# Patient Record
Sex: Female | Born: 1955 | Race: White | Hispanic: No | Marital: Married | Smoking: Never smoker
Health system: Southern US, Community
[De-identification: ages and names within clinical notes are randomized; demographics above are authoritative.]

## PROBLEM LIST (undated history)

## (undated) DIAGNOSIS — N938 Other specified abnormal uterine and vaginal bleeding: Secondary | ICD-10-CM

## (undated) DIAGNOSIS — N8000 Endometriosis of the uterus, unspecified: Secondary | ICD-10-CM

## (undated) DIAGNOSIS — R102 Pelvic and perineal pain: Secondary | ICD-10-CM

## (undated) DIAGNOSIS — K219 Gastro-esophageal reflux disease without esophagitis: Secondary | ICD-10-CM

## (undated) DIAGNOSIS — N8003 Adenomyosis of the uterus: Secondary | ICD-10-CM

## (undated) DIAGNOSIS — F419 Anxiety disorder, unspecified: Secondary | ICD-10-CM

## (undated) DIAGNOSIS — Z973 Presence of spectacles and contact lenses: Secondary | ICD-10-CM

## (undated) HISTORY — PX: COLONOSCOPY: SHX174

---

## 1998-08-22 ENCOUNTER — Other Ambulatory Visit: Admission: RE | Admit: 1998-08-22 | Discharge: 1998-08-22 | Payer: Self-pay | Admitting: Obstetrics & Gynecology

## 1999-08-31 ENCOUNTER — Other Ambulatory Visit: Admission: RE | Admit: 1999-08-31 | Discharge: 1999-08-31 | Payer: Self-pay | Admitting: Obstetrics & Gynecology

## 1999-09-21 ENCOUNTER — Encounter: Admission: RE | Admit: 1999-09-21 | Discharge: 1999-09-21 | Payer: Self-pay | Admitting: Internal Medicine

## 1999-09-21 ENCOUNTER — Encounter: Payer: Self-pay | Admitting: Internal Medicine

## 2000-09-22 ENCOUNTER — Other Ambulatory Visit: Admission: RE | Admit: 2000-09-22 | Discharge: 2000-09-22 | Payer: Self-pay | Admitting: Obstetrics & Gynecology

## 2001-07-13 ENCOUNTER — Encounter: Payer: Self-pay | Admitting: Neurosurgery

## 2001-07-13 ENCOUNTER — Encounter: Admission: RE | Admit: 2001-07-13 | Discharge: 2001-07-13 | Payer: Self-pay | Admitting: Neurosurgery

## 2001-10-19 ENCOUNTER — Other Ambulatory Visit: Admission: RE | Admit: 2001-10-19 | Discharge: 2001-10-19 | Payer: Self-pay | Admitting: Obstetrics & Gynecology

## 2002-12-09 ENCOUNTER — Other Ambulatory Visit: Admission: RE | Admit: 2002-12-09 | Discharge: 2002-12-09 | Payer: Self-pay | Admitting: Obstetrics & Gynecology

## 2004-01-09 ENCOUNTER — Other Ambulatory Visit: Admission: RE | Admit: 2004-01-09 | Discharge: 2004-01-09 | Payer: Self-pay | Admitting: Obstetrics & Gynecology

## 2005-04-24 ENCOUNTER — Other Ambulatory Visit: Admission: RE | Admit: 2005-04-24 | Discharge: 2005-04-24 | Payer: Self-pay | Admitting: Obstetrics & Gynecology

## 2007-09-25 ENCOUNTER — Encounter: Admission: RE | Admit: 2007-09-25 | Discharge: 2007-09-25 | Payer: Self-pay | Admitting: Internal Medicine

## 2012-08-21 ENCOUNTER — Other Ambulatory Visit (HOSPITAL_COMMUNITY): Payer: Self-pay | Admitting: Obstetrics & Gynecology

## 2012-08-21 DIAGNOSIS — Z139 Encounter for screening, unspecified: Secondary | ICD-10-CM

## 2012-08-25 ENCOUNTER — Ambulatory Visit (HOSPITAL_COMMUNITY)
Admission: RE | Admit: 2012-08-25 | Discharge: 2012-08-25 | Disposition: A | Discharge status: Home / Self Care | Payer: 59 | Source: Ambulatory Visit | Attending: Obstetrics & Gynecology | Admitting: Obstetrics & Gynecology

## 2012-08-25 DIAGNOSIS — Z1231 Encounter for screening mammogram for malignant neoplasm of breast: Secondary | ICD-10-CM | POA: Insufficient documentation

## 2012-08-25 DIAGNOSIS — Z139 Encounter for screening, unspecified: Secondary | ICD-10-CM

## 2012-08-26 ENCOUNTER — Other Ambulatory Visit (HOSPITAL_COMMUNITY): Payer: Self-pay | Admitting: Obstetrics & Gynecology

## 2012-08-26 DIAGNOSIS — N950 Postmenopausal bleeding: Secondary | ICD-10-CM

## 2012-09-07 DIAGNOSIS — J329 Chronic sinusitis, unspecified: Secondary | ICD-10-CM

## 2012-09-07 DIAGNOSIS — J45909 Unspecified asthma, uncomplicated: Secondary | ICD-10-CM

## 2012-09-11 ENCOUNTER — Ambulatory Visit (HOSPITAL_COMMUNITY)
Admission: RE | Admit: 2012-09-11 | Discharge: 2012-09-11 | Disposition: A | Discharge status: Home / Self Care | Payer: 59 | Source: Ambulatory Visit | Attending: Obstetrics & Gynecology | Admitting: Obstetrics & Gynecology

## 2012-09-11 DIAGNOSIS — N950 Postmenopausal bleeding: Secondary | ICD-10-CM | POA: Insufficient documentation

## 2012-09-11 DIAGNOSIS — R9389 Abnormal findings on diagnostic imaging of other specified body structures: Secondary | ICD-10-CM | POA: Insufficient documentation

## 2012-09-23 ENCOUNTER — Encounter (HOSPITAL_COMMUNITY): Payer: Self-pay | Admitting: *Deleted

## 2012-09-23 NOTE — Progress Notes (Addendum)
Pt on Phentermine/Topimax-since January-notified Dr Abram Sander surgery needs to be rescheduled 1 week out

## 2012-09-24 ENCOUNTER — Encounter (HOSPITAL_COMMUNITY)
Admission: RE | Disposition: A | Discharge status: Home / Self Care | Payer: Self-pay | Source: Ambulatory Visit | Attending: Obstetrics & Gynecology

## 2012-09-24 ENCOUNTER — Ambulatory Visit (HOSPITAL_COMMUNITY)
Admission: RE | Admit: 2012-09-24 | Discharge: 2012-09-24 | Disposition: A | Discharge status: Home / Self Care | Payer: 59 | Source: Ambulatory Visit | Attending: Obstetrics & Gynecology | Admitting: Obstetrics & Gynecology

## 2012-09-24 ENCOUNTER — Ambulatory Visit (HOSPITAL_COMMUNITY): Payer: 59 | Admitting: Anesthesiology

## 2012-09-24 ENCOUNTER — Encounter (HOSPITAL_COMMUNITY): Payer: Self-pay | Admitting: *Deleted

## 2012-09-24 ENCOUNTER — Encounter (HOSPITAL_COMMUNITY): Payer: Self-pay | Admitting: Anesthesiology

## 2012-09-24 DIAGNOSIS — N840 Polyp of corpus uteri: Secondary | ICD-10-CM

## 2012-09-24 DIAGNOSIS — R9389 Abnormal findings on diagnostic imaging of other specified body structures: Secondary | ICD-10-CM | POA: Insufficient documentation

## 2012-09-24 DIAGNOSIS — Z79890 Hormone replacement therapy (postmenopausal): Secondary | ICD-10-CM | POA: Insufficient documentation

## 2012-09-24 DIAGNOSIS — N950 Postmenopausal bleeding: Secondary | ICD-10-CM | POA: Insufficient documentation

## 2012-09-24 HISTORY — DX: Gastro-esophageal reflux disease without esophagitis: K21.9

## 2012-09-24 HISTORY — PX: HYSTEROSCOPY WITH D & C: SHX1775

## 2012-09-24 HISTORY — DX: Anxiety disorder, unspecified: F41.9

## 2012-09-24 LAB — CBC
HCT: 43.7 % (ref 36.0–46.0)
Hemoglobin: 13.8 g/dL (ref 12.0–15.0)
RDW: 13.3 % (ref 11.5–15.5)
WBC: 5.1 10*3/uL (ref 4.0–10.5)

## 2012-09-24 SURGERY — DILATATION AND CURETTAGE /HYSTEROSCOPY
Anesthesia: General

## 2012-09-24 MED ORDER — FENTANYL CITRATE 0.05 MG/ML IJ SOLN
INTRAMUSCULAR | Status: AC
  Filled 2012-09-24: qty 2

## 2012-09-24 MED ORDER — MIDAZOLAM HCL 2 MG/2ML IJ SOLN
INTRAMUSCULAR | Status: AC
  Filled 2012-09-24: qty 2

## 2012-09-24 MED ORDER — LIDOCAINE HCL (CARDIAC) 20 MG/ML IV SOLN
INTRAVENOUS | Status: AC
  Filled 2012-09-24: qty 5

## 2012-09-24 MED ORDER — FENTANYL CITRATE 0.05 MG/ML IJ SOLN
INTRAMUSCULAR | Status: DC | PRN
  Administered 2012-09-24: 100 ug via INTRAVENOUS

## 2012-09-24 MED ORDER — GENTAMICIN SULFATE 40 MG/ML IJ SOLN
INTRAMUSCULAR | Status: AC
  Administered 2012-09-24: 100 mL via INTRAVENOUS
  Filled 2012-09-24: qty 9.66

## 2012-09-24 MED ORDER — MIDAZOLAM HCL 5 MG/5ML IJ SOLN
INTRAMUSCULAR | Status: DC | PRN
  Administered 2012-09-24: 2 mg via INTRAVENOUS

## 2012-09-24 MED ORDER — LIDOCAINE HCL 1 % IJ SOLN
INTRAMUSCULAR | Status: DC | PRN
  Administered 2012-09-24: 10 mL

## 2012-09-24 MED ORDER — GLYCINE 1.5 % IR SOLN
Status: DC | PRN
  Administered 2012-09-24: 3000 mL

## 2012-09-24 MED ORDER — ONDANSETRON HCL 4 MG/2ML IJ SOLN
INTRAMUSCULAR | Status: DC | PRN
  Administered 2012-09-24: 4 mg via INTRAVENOUS

## 2012-09-24 MED ORDER — ONDANSETRON HCL 4 MG/2ML IJ SOLN
INTRAMUSCULAR | Status: AC
  Filled 2012-09-24: qty 2

## 2012-09-24 MED ORDER — PROPOFOL 10 MG/ML IV EMUL
INTRAVENOUS | Status: AC
  Filled 2012-09-24: qty 20

## 2012-09-24 MED ORDER — LIDOCAINE HCL (CARDIAC) 20 MG/ML IV SOLN
INTRAVENOUS | Status: DC | PRN
  Administered 2012-09-24: 50 mg via INTRAVENOUS

## 2012-09-24 MED ORDER — LACTATED RINGERS IV SOLN
INTRAVENOUS | Status: DC
  Administered 2012-09-24 (×2): via INTRAVENOUS

## 2012-09-24 MED ORDER — DEXAMETHASONE SODIUM PHOSPHATE 10 MG/ML IJ SOLN
INTRAMUSCULAR | Status: DC | PRN
  Administered 2012-09-24: 10 mg via INTRAVENOUS

## 2012-09-24 MED ORDER — PROPOFOL 10 MG/ML IV EMUL
INTRAVENOUS | Status: DC | PRN
  Administered 2012-09-24: 20 mg via INTRAVENOUS
  Administered 2012-09-24: 50 mg via INTRAVENOUS
  Administered 2012-09-24: 20 mg via INTRAVENOUS

## 2012-09-24 MED ORDER — LACTATED RINGERS IV SOLN
INTRAVENOUS | Status: DC
  Administered 2012-09-24: 09:00:00 via INTRAVENOUS

## 2012-09-24 SURGICAL SUPPLY — 15 items
ABLATOR ENDOMETRIAL BIPOLAR (ABLATOR) IMPLANT
CANISTER SUCTION 2500CC (MISCELLANEOUS) ×2 IMPLANT
CLOTH BEACON ORANGE TIMEOUT ST (SAFETY) ×2 IMPLANT
CONTAINER PREFILL 10% NBF 60ML (FORM) ×4 IMPLANT
DRESSING TELFA 8X3 (GAUZE/BANDAGES/DRESSINGS) ×2 IMPLANT
ELECT REM PT RETURN 9FT ADLT (ELECTROSURGICAL)
ELECTRODE REM PT RTRN 9FT ADLT (ELECTROSURGICAL) IMPLANT
GLOVE BIO SURGEON STRL SZ7.5 (GLOVE) ×2 IMPLANT
GOWN PREVENTION PLUS XLARGE (GOWN DISPOSABLE) ×2 IMPLANT
GOWN STRL REIN XL XLG (GOWN DISPOSABLE) ×4 IMPLANT
LOOP ANGLED CUTTING 22FR (CUTTING LOOP) IMPLANT
PACK HYSTEROSCOPY LF (CUSTOM PROCEDURE TRAY) ×2 IMPLANT
PAD OB MATERNITY 4.3X12.25 (PERSONAL CARE ITEMS) ×2 IMPLANT
TOWEL OR 17X24 6PK STRL BLUE (TOWEL DISPOSABLE) ×4 IMPLANT
WATER STERILE IRR 1000ML POUR (IV SOLUTION) ×2 IMPLANT

## 2012-09-24 NOTE — Transfer of Care (Signed)
Immediate Anesthesia Transfer of Care Note  Patient: Kathleen Wise  Procedure(s) Performed: Procedure(s) (LRB) with comments: DILATATION AND CURETTAGE /HYSTEROSCOPY (N/A)  Patient Location: PACU  Anesthesia Type: General  Level of Consciousness: sedated  Airway & Oxygen Therapy: Patient Spontanous Breathing and Patient connected to nasal cannula oxygen  Post-op Assessment: Report given to PACU RN  Post vital signs: Reviewed and stable  Complications: No apparent anesthesia complications

## 2012-09-24 NOTE — H&P (Signed)
Kathleen Wise, Kathleen Wise                  ACCOUNT NO.:  [id number]  MEDICAL RECORD NO.:  [id number]  LOCATION:  [facility]                         FACILITY:  [facility]  PHYSICIAN:  Freddy Finner, M.D.   DATE OF BIRTH:  September 22, 1956  DATE OF ADMISSION:  09/22/2012 DATE OF DISCHARGE:                             HISTORY & PHYSICAL   ADMITTING DIAGNOSES:  Postmenopausal bleeding, endometrial thickening, uterine leiomyomata.  PRESENT ILLNESS:  The patient is a 56 year old white married female, gravida 2, para 2, who was on hormone replacement therapy and had an episode of dysfunctional uterine bleeding.  She had transabdominal and transvaginal ultrasound at the [facility] facility on September 15, 2012 which showed suboptimal visualization of the endometrium and uterine leiomyomata.  She was seen in my office on the 22nd of this month at which time a sonohysterogram was performed and this showed significant endometrial thickening.  The myomas were confirmed.  There were no other abnormal findings on ultrasound at this facility.  She is admitted now for hysteroscopy, D and C.  CURRENT REVIEW OF SYSTEMS:  Otherwise negative.  There are no cardiopulmonary, GI, or GU complaints except the bleeding.  PAST MEDICAL HISTORY:  She has no known significant medical illnesses. She does have some situational depression for which she takes clonazepam 0.5 mg at bedtime, Wellbutrin daily, Lexapro 10 mg a day.  She has GERD for which she takes Protonix 1/2 a tablet every other day.  ALLERGIES TO MEDICATIONS:  PENICILLIN SULFA, CODEINE, NAPROSYN, and ALEVE.  SURGICAL PROCEDURES:  Two vaginal deliveries.  No other history of surgical procedures.  Her regular internist is [employer].  She does not use cigarettes, she does not use alcohol.  She has never had a blood transfusion.  FAMILY HISTORY:  Noncontributory.  PHYSICAL EXAMINATION:  HEENT:  Grossly within normal limits.  Thyroid gland is not palpably enlarged to  my examination. VITAL SIGNS:  Blood pressure in the office 140/90, weight 214, height 5 feet 7-1/2 inches. CHEST:  Clear to auscultation throughout. HEART:  Normal sinus rhythm without murmurs, rubs, or gallops. BREASTS:  Normal.  No skin change, no nipple discharge.  No palpable masses.  ABDOMEN:  Soft and nontender without appreciable organomegaly. PELVIC:  External genitalia, vagina, and cervix are normal to inspection.  Bimanual exam reveals the uterus to be anterior in position.  By clinical exam, there is no palpable increase in size of the uterus or adnexal masses.  Rectovaginal exam confirms.  Please note that the exam was somewhat compromised by body habitus. EXTREMITIES:  Without cyanosis, clubbing, or edema.  ASSESSMENT:  Postmenopausal bleeding, ultrasound findings of uterine leiomyomata and endometrial thickening, possible endometrial polyps.  PLAN:  Hysteroscopy, D and C.  The patient has reviewed ACOG materials on both procedures including potential risk of infection, uterine perforation, intraoperative or postoperative bleeding, deep vein thrombosis, pulmonary issues.  Appropriate therapeutic prophylactic measures have also been discussed and will be utilized.     Freddy Finner, M.D.     WRN/MEDQ  D:  09/23/2012  T:  09/24/2012  Job:  [id number]

## 2012-09-24 NOTE — Anesthesia Preprocedure Evaluation (Signed)
Anesthesia Evaluation    Airway Mallampati: III TM Distance: >3 FB Neck ROM: Full    Dental No notable dental hx. (+) Teeth Intact   Pulmonary neg pulmonary ROS,  breath sounds clear to auscultation  Pulmonary exam normal       Cardiovascular negative cardio ROS  Rhythm:Regular Rate:Normal     Neuro/Psych Seizures -,  Anxiety Remote hx/o seizures    GI/Hepatic Neg liver ROS, GERD-  Medicated and Controlled,  Endo/Other  negative endocrine ROS  Renal/GU negative Renal ROS  negative genitourinary   Musculoskeletal negative musculoskeletal ROS (+)   Abdominal Normal abdominal exam  (+)   Peds  Hematology negative hematology ROS (+)   Anesthesia Other Findings   Reproductive/Obstetrics negative OB ROS                           Anesthesia Physical Anesthesia Plan  ASA: II  Anesthesia Plan: General   Post-op Pain Management:    Induction: Intravenous  Airway Management Planned: Natural Airway  Additional Equipment:   Intra-op Plan:   Post-operative Plan: Extubation in OR  Informed Consent: I have reviewed the patients History and Physical, chart, labs and discussed the procedure including the risks, benefits and alternatives for the proposed anesthesia with the patient or authorized representative who has indicated his/her understanding and acceptance.   Dental advisory given  Plan Discussed with: CRNA, Anesthesiologist and Surgeon  Anesthesia Plan Comments:         Anesthesia Quick Evaluation

## 2012-09-24 NOTE — Anesthesia Postprocedure Evaluation (Signed)
  Anesthesia Post-op Note  Patient: Kathleen Wise  Procedure(s) Performed: Procedure(s) (LRB) with comments: DILATATION AND CURETTAGE /HYSTEROSCOPY (N/A)  Patient Location: PACU  Anesthesia Type: General  Level of Consciousness: awake, alert  and oriented  Airway and Oxygen Therapy: Patient Spontanous Breathing  Post-op Pain: none  Post-op Assessment: Post-op Vital signs reviewed, Patient's Cardiovascular Status Stable, Respiratory Function Stable, Patent Airway, No signs of Nausea or vomiting and Pain level controlled  Post-op Vital Signs: Reviewed and stable  Complications: No apparent anesthesia complications

## 2012-09-25 ENCOUNTER — Encounter (HOSPITAL_COMMUNITY): Payer: Self-pay | Admitting: Obstetrics & Gynecology

## 2012-09-25 NOTE — Op Note (Signed)
NAMEAALIYA, MAULTSBY                  ACCOUNT NO.:  [id number]  MEDICAL RECORD NO.:  [id number]  LOCATION:  [facility]                          FACILITY:  [facility]  PHYSICIAN:  Freddy Finner, M.D.   DATE OF BIRTH:  October 17, 1956  DATE OF PROCEDURE:  09/24/2012 DATE OF DISCHARGE:  09/24/2012                              OPERATIVE REPORT   PREOPERATIVE DIAGNOSIS:  Postmenopausal bleeding, on hormone replacement therapy, endometrial thickening on sonohysterogram in the office.  POSTOPERATIVE DIAGNOSIS:  Postmenopausal bleeding, on hormone replacement therapy, endometrial thickening on sonohysterogram in the office.  PROCEDURE:  Hysteroscopy, D and C.  ANESTHESIA:  General endotracheal.  ESTIMATED INTRAOPERATIVE BLOOD LOSS:  Less than 10 mL.  Sorbitol deficit 50 mL.  Additional anesthesia paracervical block using a total of 10 mL of 1% plain Xylocaine.  INTRAOPERATIVE COMPLICATIONS:  None.  Details of the present illness recorded in the admission note.  The patient was admitted on the morning of surgery.  Intraoperative antibiotics included clindamycin and gentamicin.  The patient was placed in PAS hose.  She was placed under general anesthesia, placed in the dorsal lithotomy position.  After time out, prep and drape was carried out using Betadine solution, standard procedure.  Bivalve speculum was introduced.  Cervix was visualized, grasped on the anterior lip with a single-tooth tenaculum.  __________ was used to identify the internal os.  Uterus sounded to 8 cm.  Cervix was progressively dilated to 23 with [facility] dilators.  A 12.5-degree hysteroscope was introduced using 3% sorbitol as distending medium and the cavity visualized and photographed.  There were at least 3 areas where the endometrium appeared to be thickening, although not arising to the level of polyp. Gentle thorough curettage was carried out using Heaney curette followed by the sharp curette and exploration with Randall  stone forceps to collect tissue after reinspection and additional curettage with a sharp curette was carried out.  Reinspection second time revealed complete sampling of the endometrium.  At this point, the procedure was terminated.  The instruments removed after placing a paracervical block with 10 mL of Xylocaine with deep injections at 3 and 9 o'clock in the vaginal fornices at a depth of approximately 2.5 to 3 cm.  The patient was then awakened, taken to recovery.  She will be given routine outpatient surgical instructions.  She has tramadol to be used as needed for postoperative pain, which she has already.  She is to return to the office in 7-14 days for her postoperative followup.     Freddy Finner, M.D.     WRN/MEDQ  D:  09/24/2012  T:  09/25/2012  Job:  [id number]

## 2012-11-15 ENCOUNTER — Encounter (HOSPITAL_COMMUNITY): Payer: Self-pay | Admitting: Emergency Medicine

## 2012-11-15 ENCOUNTER — Inpatient Hospital Stay (HOSPITAL_COMMUNITY)
Admission: EM | Admit: 2012-11-15 | Discharge: 2012-11-15 | Disposition: A | Discharge status: Home / Self Care | Payer: 59 | Source: Home / Self Care | Attending: Emergency Medicine | Admitting: Emergency Medicine

## 2012-11-15 DIAGNOSIS — J329 Chronic sinusitis, unspecified: Secondary | ICD-10-CM

## 2012-11-15 DIAGNOSIS — J45909 Unspecified asthma, uncomplicated: Secondary | ICD-10-CM

## 2012-11-15 MED ORDER — AZITHROMYCIN 250 MG PO TABS: 250.0000 mg | ORAL_TABLET | Freq: Every day | ORAL | Status: DC

## 2012-11-15 MED ORDER — ALBUTEROL SULFATE HFA 108 (90 BASE) MCG/ACT IN AERS: 1.0000 | INHALATION_SPRAY | Freq: Four times a day (QID) | RESPIRATORY_TRACT | Status: DC | PRN

## 2012-11-15 MED ORDER — FEXOFENADINE HCL 60 MG PO TABS: 60.0000 mg | ORAL_TABLET | Freq: Two times a day (BID) | ORAL | Status: DC

## 2012-11-15 NOTE — ED Notes (Signed)
Reports recent exposure to rsv.  Patient reports coughing up green phlegm, tight in chest noted with breathing.  Reports fever running 100. Reports onset one week ago.  Patient is expected to assist with the arrival of grandchildren-triplets arriving home from hospital.

## 2012-11-15 NOTE — ED Provider Notes (Signed)
History     CSN: [id number]  Arrival date & time 11/15/12  1114   First MD Initiated Contact with Patient 11/15/12 1146      Chief Complaint  Patient presents with  . URI    (Consider location/radiation/quality/duration/timing/severity/associated sxs/prior treatment) HPI Comments: Patient presents urgent care describing that she's been sick with for approximately 2-3 weeks initially she was infected with RSV as her grandson was diagnosed with a she's been having cough congestion and fevers later on developed some sinus pressure and congestion (patient points to both maxillary sinuses). Most recently she's been feeling that her chest is congested and she's coughing up green phlegm and also noticed her chest feel tight at times and she's not breathing well. She has been running low-grade temperatures at home within the last week or so. At time of exam patient is denying any chest pains or shortness of breath.  Patient is a 56 y.o. female presenting with URI. The history is provided by the patient.  URI The primary symptoms include fever, sore throat and cough. Primary symptoms do not include headaches, wheezing, nausea, vomiting, myalgias, arthralgias or rash. The current episode started 6 to 7 days ago. This is a new problem. The problem has not changed since onset. Symptoms associated with the illness include chills, facial pain, sinus pressure, congestion and rhinorrhea.    Past Medical History  Diagnosis Date  . Anxiety   . GERD (gastroesophageal reflux disease)     protonix  . 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 Colonoscopy   . Hysteroscopy w/d&c 09/24/2012    Procedure: DILATATION AND CURETTAGE /HYSTEROSCOPY;  Surgeon: Freddy Finner, MD;  Location: [facility];  Service: Gynecology;  Laterality: N/A;    No family history on file.  History  Substance Use Topics  . Smoking status: Never Smoker   . Smokeless tobacco: Not on file   . Alcohol Use: Yes     Comment: social    OB History    Grav Para Term Preterm Abortions TAB SAB Ect Mult Living                  Review of Systems  Constitutional: Positive for fever, chills and activity change.  HENT: Positive for congestion, sore throat, rhinorrhea and sinus pressure. Negative for neck pain and neck stiffness.   Respiratory: Positive for cough and chest tightness. Negative for choking and wheezing.   Cardiovascular: Negative for chest pain and palpitations.  Gastrointestinal: Negative for nausea and vomiting.  Musculoskeletal: Negative for myalgias and arthralgias.  Skin: Negative for color change and rash.  Neurological: Negative for dizziness and headaches.    Allergies  Aleve; Bactrim; Codeine; Penicillins; and Sulfa antibiotics  Home Medications   Current Outpatient Rx  Name  Route  Sig  Dispense  Refill  . BUPROPION HCL ER (XL) 300 MG PO TB24   Oral   Take 300 mg by mouth daily.         Marland Kitchen CLONAZEPAM 1 MG PO TABS   Oral   Take 0.5 mg by mouth at bedtime.         Marland Kitchen DM-GUAIFENESIN ER 30-600 MG PO TB12   Oral   Take 1 tablet by mouth every 12 (twelve) hours.         . ESCITALOPRAM OXALATE 10 MG PO TABS   Oral   Take 10 mg by mouth daily.         Marland Kitchen  ESTRADIOL 0.1 MG/24HR TD PTTW   Transdermal   Place 1 patch onto the skin 2 (two) times a week.         Marland Kitchen PANTOPRAZOLE SODIUM 40 MG PO TBEC   Oral   Take 40 mg by mouth daily.         Marland Kitchen PROGESTERONE MICRONIZED 100 MG PO CAPS   Oral   Take 100 mg by mouth continuous.         . ALBUTEROL SULFATE HFA 108 (90 BASE) MCG/ACT IN AERS   Inhalation   Inhale 1-2 puffs into the lungs every 6 (six) hours as needed for wheezing.   1 Inhaler   0   . AZITHROMYCIN 250 MG PO TABS   Oral   Take 1 tablet (250 mg total) by mouth daily. Take first 2 tablets together, then 1 every day until finished.   6 tablet   0   . FEXOFENADINE HCL 60 MG PO TABS   Oral   Take 1 tablet (60 mg total) by  mouth 2 (two) times daily.   14 tablet   0     BP 160/92  Pulse 94  Temp 98.6 F (37 C) (Oral)  Resp 20  SpO2 95%  Physical Exam  Nursing note and vitals reviewed. Constitutional: Vital signs are normal. She appears well-developed and well-nourished.  Non-toxic appearance. She does not have a sickly appearance. She does not appear ill. No distress.  HENT:  Head: Normocephalic.  Right Ear: Tympanic membrane normal.  Left Ear: Tympanic membrane normal.  Mouth/Throat: Uvula is midline and mucous membranes are normal. Posterior oropharyngeal erythema present.  Eyes: Conjunctivae normal are normal.  Neck: Neck supple.  Cardiovascular: Normal rate and regular rhythm.  Exam reveals no gallop and no friction rub.   No murmur heard. Pulmonary/Chest: Effort normal and breath sounds normal. No respiratory distress.  Neurological: She is alert.  Skin: No rash noted. No erythema.    ED Course  Procedures (including critical care time)  Labs Reviewed - No data to display No results found.   1. Sinusitis   2. Reactive airway disease       MDM  Symptoms and exam consistent with maxillary sinusitis also with mild reactive airway disease. Patient has been prescribed any antibiotics cycle with a macrolide along with a bronchodilator to be used every 6-8 hours as needed. This was also instructed to use an antihistamine. Shortness stable mildly hypertensive in no respiratory distress. Patient understood discharge instructions and agrees with treatment plan and followup care as necessary.        Jimmie Molly, MD 11/15/12 1330

## 2012-11-30 ENCOUNTER — Ambulatory Visit (HOSPITAL_COMMUNITY)
Admission: RE | Admit: 2012-11-30 | Discharge: 2012-11-30 | Disposition: A | Discharge status: Home / Self Care | Payer: 59 | Source: Ambulatory Visit | Attending: Pulmonary Disease | Admitting: Pulmonary Disease

## 2012-11-30 ENCOUNTER — Other Ambulatory Visit (HOSPITAL_COMMUNITY): Payer: Self-pay | Admitting: Pulmonary Disease

## 2012-11-30 DIAGNOSIS — J3489 Other specified disorders of nose and nasal sinuses: Secondary | ICD-10-CM | POA: Insufficient documentation

## 2012-11-30 DIAGNOSIS — R05 Cough: Secondary | ICD-10-CM | POA: Insufficient documentation

## 2012-11-30 DIAGNOSIS — R059 Cough, unspecified: Secondary | ICD-10-CM | POA: Insufficient documentation

## 2012-11-30 DIAGNOSIS — J40 Bronchitis, not specified as acute or chronic: Secondary | ICD-10-CM | POA: Insufficient documentation

## 2013-11-19 ENCOUNTER — Other Ambulatory Visit: Payer: Self-pay | Admitting: Obstetrics & Gynecology

## 2013-11-26 ENCOUNTER — Other Ambulatory Visit: Payer: Self-pay | Admitting: Obstetrics & Gynecology

## 2013-11-26 DIAGNOSIS — R928 Other abnormal and inconclusive findings on diagnostic imaging of breast: Secondary | ICD-10-CM

## 2013-12-03 ENCOUNTER — Ambulatory Visit
Admission: RE | Admit: 2013-12-03 | Discharge: 2013-12-03 | Disposition: A | Discharge status: Home / Self Care | Payer: 59 | Source: Ambulatory Visit | Attending: Obstetrics & Gynecology | Admitting: Obstetrics & Gynecology

## 2013-12-03 ENCOUNTER — Other Ambulatory Visit: Payer: Self-pay | Admitting: Obstetrics & Gynecology

## 2013-12-03 DIAGNOSIS — R928 Other abnormal and inconclusive findings on diagnostic imaging of breast: Secondary | ICD-10-CM

## 2014-10-22 NOTE — H&P (Signed)
NAMEMARRIANA, Wise                  ACCOUNT NO.:  [id number]  MEDICAL RECORD NO.:  [id number]  LOCATION:                                 FACILITY:  PHYSICIAN:  Maisie Fus, M.D.   DATE OF BIRTH:  1956-03-06  DATE OF ADMISSION:  11/01/2014 DATE OF DISCHARGE:                             HISTORY & PHYSICAL   ADMITTING DIAGNOSES: 1. Uterine leiomyomata. 2. Persistent dysfunctional uterine bleeding. 3. Pelvic pain.  HISTORY OF PRESENT ILLNESS:  The patient is a 58 year old white married female, gravida 2, para 2, who has been on hormone replacement therapy since the menopausal transition and has had some dysfunctional uterine bleeding, which we attempted to control with a Mirena IUD.  She has also had an endometrial biopsy recently with benign findings and is currently on progestin, actually Prometrium 100 mg a day at bedtime to control her dysfunctional uterine bleeding.  She has requested definitive surgical intervention.  She is admitted now for laparoscopically-assisted vaginal hysterectomy with bilateral salpingo-oophorectomy.  She has reviewed the ACOG materials regarding the procedure including the risk of a larger operative procedure, the risk of perioperative infection, the risk of injury to other organs, the risk of a larger procedure, and the risk of deep vein thrombosis.  She is now admitted and prepared to proceed with surgery.  PAST MEDICAL HISTORY:  The patient has no known significant medical illnesses.  She does have some situational anxiety.  For that, she takes Wellbutrin and Lexapro daily.  She takes clonazepam p.r.n.  She does have GERD for which she takes Protonix and she has seasonal allergies for which she takes over-the-counter medications.  She is currently on Minivelle 0.1 skin patch for hormone replacement therapy as well as progestin.  FAMILY HISTORY:  Noncontributory.  PHYSICAL EXAMINATION:  HEENT:  Grossly within normal limits. VITAL SIGNS:   Blood pressure in the office 138/82, weight 236, height 5 feet and 8 inches. NECK:  Thyroid gland is not palpably enlarged. CHEST:  Clear to auscultation. HEART:  Normal sinus rhythm without murmurs, rubs, or gallops. ABDOMEN:  Soft and nontender without appreciable organomegaly or palpable masses. EXTREMITIES:  Without cyanosis, clubbing, or edema. PELVIC:  The external genitalia, vagina, and cervix were normal. Bimanual reveals uterus to be anterior in position, slightly enlarged and no palpable adnexal masses.  The rectum is palpably normal and rectovaginal exam confirms the above findings.  ASSESSMENT: 1. Uterine leiomyomata. 2. Pelvic pain. 3. Dysfunctional uterine bleeding.  PLAN:  Laparoscopically-assisted vaginal hysterectomy with bilateral salpingo-oophorectomy.  Perioperative IV antibiotics, PAS hose, and early ambulation.     Maisie Fus, M.D.     WRN/MEDQ  D:  10/20/2014  T:  10/20/2014  Job:  [id number]

## 2014-10-24 ENCOUNTER — Encounter (HOSPITAL_BASED_OUTPATIENT_CLINIC_OR_DEPARTMENT_OTHER): Payer: Self-pay | Admitting: *Deleted

## 2014-10-24 NOTE — Progress Notes (Signed)
NPO AFTER MN. ARRIVE AT 0600. NEEDS HG. PT AWARE OWER AT MAIN. WILL TAKE AM MEDS W/ SIPS OF WATER.  PRE-OP ORDERS PENDING.

## 2014-11-01 ENCOUNTER — Ambulatory Visit (HOSPITAL_BASED_OUTPATIENT_CLINIC_OR_DEPARTMENT_OTHER): Payer: 59 | Admitting: Anesthesiology

## 2014-11-01 ENCOUNTER — Encounter (HOSPITAL_COMMUNITY)
Admission: RE | Disposition: A | Discharge status: Home / Self Care | Payer: Self-pay | Source: Ambulatory Visit | Attending: Obstetrics & Gynecology

## 2014-11-01 ENCOUNTER — Encounter (HOSPITAL_BASED_OUTPATIENT_CLINIC_OR_DEPARTMENT_OTHER): Payer: Self-pay

## 2014-11-01 ENCOUNTER — Observation Stay (HOSPITAL_BASED_OUTPATIENT_CLINIC_OR_DEPARTMENT_OTHER)
Admission: RE | Admit: 2014-11-01 | Discharge: 2014-11-02 | Disposition: A | Discharge status: Home / Self Care | Payer: 59 | Source: Ambulatory Visit | Attending: Obstetrics & Gynecology | Admitting: Obstetrics & Gynecology

## 2014-11-01 DIAGNOSIS — D259 Leiomyoma of uterus, unspecified: Principal | ICD-10-CM | POA: Insufficient documentation

## 2014-11-01 DIAGNOSIS — E669 Obesity, unspecified: Secondary | ICD-10-CM | POA: Diagnosis not present

## 2014-11-01 DIAGNOSIS — Z90710 Acquired absence of both cervix and uterus: Secondary | ICD-10-CM | POA: Diagnosis present

## 2014-11-01 DIAGNOSIS — R102 Pelvic and perineal pain: Secondary | ICD-10-CM | POA: Diagnosis not present

## 2014-11-01 DIAGNOSIS — K219 Gastro-esophageal reflux disease without esophagitis: Secondary | ICD-10-CM | POA: Diagnosis not present

## 2014-11-01 DIAGNOSIS — Z79899 Other long term (current) drug therapy: Secondary | ICD-10-CM | POA: Insufficient documentation

## 2014-11-01 DIAGNOSIS — F419 Anxiety disorder, unspecified: Secondary | ICD-10-CM | POA: Diagnosis not present

## 2014-11-01 DIAGNOSIS — N938 Other specified abnormal uterine and vaginal bleeding: Secondary | ICD-10-CM | POA: Insufficient documentation

## 2014-11-01 HISTORY — DX: Presence of spectacles and contact lenses: Z97.3

## 2014-11-01 HISTORY — DX: Other specified abnormal uterine and vaginal bleeding: N93.8

## 2014-11-01 HISTORY — DX: Adenomyosis of the uterus: N80.03

## 2014-11-01 HISTORY — PX: SALPINGOOPHORECTOMY: SHX82

## 2014-11-01 HISTORY — PX: LAPAROSCOPIC ASSISTED VAGINAL HYSTERECTOMY: SHX5398

## 2014-11-01 HISTORY — DX: Pelvic and perineal pain: R10.2

## 2014-11-01 HISTORY — DX: Endometriosis of uterus: N80.0

## 2014-11-01 HISTORY — DX: Endometriosis of the uterus, unspecified: N80.00

## 2014-11-01 LAB — POCT I-STAT, CHEM 8
BUN: 19 mg/dL (ref 6–23)
Calcium, Ion: 1.15 mmol/L (ref 1.12–1.23)
Chloride: 107 mEq/L (ref 96–112)
Creatinine, Ser: 0.9 mg/dL (ref 0.50–1.10)
Glucose, Bld: 131 mg/dL — ABNORMAL HIGH (ref 70–99)
HEMATOCRIT: 46 % (ref 36.0–46.0)
Hemoglobin: 15.6 g/dL — ABNORMAL HIGH (ref 12.0–15.0)
POTASSIUM: 4.3 meq/L (ref 3.7–5.3)
Sodium: 139 mEq/L (ref 137–147)
TCO2: 23 mmol/L (ref 0–100)

## 2014-11-01 SURGERY — HYSTERECTOMY, VAGINAL, LAPAROSCOPY-ASSISTED
Anesthesia: General | Site: Abdomen

## 2014-11-01 MED ORDER — CLINDAMYCIN PHOSPHATE 900 MG/50ML IV SOLN
900.0000 mg | Freq: Once | INTRAVENOUS | Status: AC
  Administered 2014-11-01: 900 mg via INTRAVENOUS
  Filled 2014-11-01 (×2): qty 50

## 2014-11-01 MED ORDER — SODIUM CHLORIDE 0.9 % IJ SOLN
9.0000 mL | INTRAMUSCULAR | Status: DC | PRN
  Filled 2014-11-01: qty 9

## 2014-11-01 MED ORDER — MORPHINE SULFATE 2 MG/ML IJ SOLN
1.0000 mg | INTRAMUSCULAR | Status: DC | PRN
  Administered 2014-11-01: 1 mg via INTRAVENOUS
  Administered 2014-11-01: 2 mg via INTRAVENOUS
  Administered 2014-11-01: 1 mg via INTRAVENOUS
  Administered 2014-11-01: 2 mg via INTRAVENOUS
  Filled 2014-11-01 (×5): qty 1

## 2014-11-01 MED ORDER — FENTANYL CITRATE 0.05 MG/ML IJ SOLN
INTRAMUSCULAR | Status: AC
  Filled 2014-11-01: qty 10

## 2014-11-01 MED ORDER — KETOROLAC TROMETHAMINE 30 MG/ML IJ SOLN
15.0000 mg | Freq: Once | INTRAMUSCULAR | Status: DC | PRN
  Filled 2014-11-01: qty 1

## 2014-11-01 MED ORDER — OXYCODONE-ACETAMINOPHEN 5-325 MG PO TABS
1.0000 | ORAL_TABLET | ORAL | Status: DC | PRN
  Filled 2014-11-01: qty 2

## 2014-11-01 MED ORDER — ONDANSETRON HCL 4 MG PO TABS
4.0000 mg | ORAL_TABLET | Freq: Four times a day (QID) | ORAL | Status: DC | PRN
  Filled 2014-11-01: qty 1

## 2014-11-01 MED ORDER — NEOSTIGMINE METHYLSULFATE 10 MG/10ML IV SOLN
INTRAVENOUS | Status: DC | PRN
  Administered 2014-11-01: 5 mg via INTRAVENOUS

## 2014-11-01 MED ORDER — SIMETHICONE 80 MG PO CHEW
80.0000 mg | CHEWABLE_TABLET | Freq: Four times a day (QID) | ORAL | Status: DC | PRN
  Filled 2014-11-01: qty 1

## 2014-11-01 MED ORDER — ONDANSETRON HCL 4 MG/2ML IJ SOLN
4.0000 mg | Freq: Four times a day (QID) | INTRAMUSCULAR | Status: DC | PRN
  Filled 2014-11-01: qty 2

## 2014-11-01 MED ORDER — SUCCINYLCHOLINE CHLORIDE 20 MG/ML IJ SOLN
INTRAMUSCULAR | Status: DC | PRN
  Administered 2014-11-01: 100 mg via INTRAVENOUS

## 2014-11-01 MED ORDER — DEXAMETHASONE SODIUM PHOSPHATE 4 MG/ML IJ SOLN
INTRAMUSCULAR | Status: DC | PRN
  Administered 2014-11-01: 10 mg via INTRAVENOUS

## 2014-11-01 MED ORDER — FENTANYL CITRATE 0.05 MG/ML IJ SOLN
25.0000 ug | INTRAMUSCULAR | Status: DC | PRN
  Administered 2014-11-01 (×4): 25 ug via INTRAVENOUS
  Filled 2014-11-01: qty 1

## 2014-11-01 MED ORDER — HYDROMORPHONE HCL 2 MG PO TABS
1.0000 mg | ORAL_TABLET | ORAL | Status: DC | PRN
  Administered 2014-11-02 (×2): 2 mg via ORAL
  Administered 2014-11-02: 1 mg via ORAL
  Filled 2014-11-01 (×3): qty 1

## 2014-11-01 MED ORDER — MIDAZOLAM HCL 2 MG/2ML IJ SOLN
INTRAMUSCULAR | Status: AC
  Filled 2014-11-01: qty 2

## 2014-11-01 MED ORDER — PROPOFOL 10 MG/ML IV BOLUS
INTRAVENOUS | Status: DC | PRN
  Administered 2014-11-01: 200 mg via INTRAVENOUS

## 2014-11-01 MED ORDER — DIPHENHYDRAMINE HCL 50 MG/ML IJ SOLN
12.5000 mg | Freq: Four times a day (QID) | INTRAMUSCULAR | Status: DC | PRN
  Filled 2014-11-01: qty 0.25

## 2014-11-01 MED ORDER — NALOXONE HCL 0.4 MG/ML IJ SOLN
0.4000 mg | INTRAMUSCULAR | Status: DC | PRN
  Filled 2014-11-01: qty 1

## 2014-11-01 MED ORDER — DIPHENHYDRAMINE HCL 50 MG/ML IJ SOLN
25.0000 mg | Freq: Three times a day (TID) | INTRAMUSCULAR | Status: DC | PRN
  Administered 2014-11-01: 25 mg via INTRAVENOUS
  Filled 2014-11-01: qty 1

## 2014-11-01 MED ORDER — MIDAZOLAM HCL 5 MG/5ML IJ SOLN
INTRAMUSCULAR | Status: DC | PRN
  Administered 2014-11-01: 2 mg via INTRAVENOUS

## 2014-11-01 MED ORDER — PROMETHAZINE HCL 25 MG/ML IJ SOLN
6.2500 mg | INTRAMUSCULAR | Status: DC | PRN
  Filled 2014-11-01: qty 1

## 2014-11-01 MED ORDER — DEXTROSE 5 % IV SOLN
400.0000 mg | Freq: Once | INTRAVENOUS | Status: DC
  Filled 2014-11-01: qty 10

## 2014-11-01 MED ORDER — BUPIVACAINE HCL (PF) 0.25 % IJ SOLN
INTRAMUSCULAR | Status: DC | PRN
  Administered 2014-11-01: 8 mL

## 2014-11-01 MED ORDER — IBUPROFEN 200 MG PO TABS
600.0000 mg | ORAL_TABLET | Freq: Four times a day (QID) | ORAL | Status: DC | PRN
  Administered 2014-11-02: 600 mg via ORAL
  Filled 2014-11-01: qty 1
  Filled 2014-11-01: qty 3

## 2014-11-01 MED ORDER — ONDANSETRON HCL 4 MG/2ML IJ SOLN
4.0000 mg | Freq: Four times a day (QID) | INTRAMUSCULAR | Status: DC | PRN
Start: 2014-11-01 — End: 2014-11-02
  Filled 2014-11-01: qty 2

## 2014-11-01 MED ORDER — GENTAMICIN SULFATE 40 MG/ML IJ SOLN
INTRAVENOUS | Status: DC
  Filled 2014-11-01: qty 13.25

## 2014-11-01 MED ORDER — LACTATED RINGERS IV SOLN
INTRAVENOUS | Status: DC
  Administered 2014-11-01 (×3): via INTRAVENOUS
  Filled 2014-11-01: qty 1000

## 2014-11-01 MED ORDER — DOCUSATE SODIUM 100 MG PO CAPS
100.0000 mg | ORAL_CAPSULE | Freq: Two times a day (BID) | ORAL | Status: DC
  Administered 2014-11-01: 100 mg via ORAL
  Filled 2014-11-01 (×4): qty 1

## 2014-11-01 MED ORDER — LIDOCAINE HCL (CARDIAC) 20 MG/ML IV SOLN
INTRAVENOUS | Status: DC | PRN
  Administered 2014-11-01: 80 mg via INTRAVENOUS

## 2014-11-01 MED ORDER — ROCURONIUM BROMIDE 100 MG/10ML IV SOLN
INTRAVENOUS | Status: DC | PRN
  Administered 2014-11-01: 20 mg via INTRAVENOUS
  Administered 2014-11-01: 30 mg via INTRAVENOUS

## 2014-11-01 MED ORDER — METOCLOPRAMIDE HCL 5 MG/ML IJ SOLN
INTRAMUSCULAR | Status: DC | PRN
  Administered 2014-11-01: 10 mg via INTRAVENOUS

## 2014-11-01 MED ORDER — GLYCOPYRROLATE 0.2 MG/ML IJ SOLN
INTRAMUSCULAR | Status: DC | PRN
  Administered 2014-11-01: .8 mg via INTRAVENOUS

## 2014-11-01 MED ORDER — FENTANYL CITRATE 0.05 MG/ML IJ SOLN
INTRAMUSCULAR | Status: DC | PRN
  Administered 2014-11-01: 50 ug via INTRAVENOUS
  Administered 2014-11-01 (×3): 100 ug via INTRAVENOUS
  Administered 2014-11-01 (×2): 50 ug via INTRAVENOUS

## 2014-11-01 MED ORDER — DIPHENHYDRAMINE HCL 12.5 MG/5ML PO ELIX
12.5000 mg | ORAL_SOLUTION | Freq: Four times a day (QID) | ORAL | Status: DC | PRN
  Filled 2014-11-01: qty 5

## 2014-11-01 MED ORDER — FENTANYL CITRATE 0.05 MG/ML IJ SOLN
INTRAMUSCULAR | Status: AC
Start: 2014-11-01 — End: 2014-11-01
  Filled 2014-11-01: qty 2

## 2014-11-01 MED ORDER — LACTATED RINGERS IR SOLN
Status: DC | PRN
  Administered 2014-11-01: 3000 mL

## 2014-11-01 MED ORDER — LACTATED RINGERS IV SOLN
INTRAVENOUS | Status: DC
  Filled 2014-11-01: qty 1000

## 2014-11-01 MED ORDER — DEXTROSE IN LACTATED RINGERS 5 % IV SOLN
INTRAVENOUS | Status: DC
  Administered 2014-11-01 – 2014-11-02 (×3): via INTRAVENOUS
  Filled 2014-11-01: qty 1000

## 2014-11-01 MED ORDER — ONDANSETRON HCL 4 MG/2ML IJ SOLN
INTRAMUSCULAR | Status: DC | PRN
  Administered 2014-11-01: 4 mg via INTRAVENOUS

## 2014-11-01 MED ORDER — ACETAMINOPHEN 10 MG/ML IV SOLN
INTRAVENOUS | Status: DC | PRN
  Administered 2014-11-01: 1000 mg via INTRAVENOUS

## 2014-11-01 SURGICAL SUPPLY — 66 items
APPLICATOR COTTON TIP 6IN STRL (MISCELLANEOUS) ×3 IMPLANT
BAG URINE DRAINAGE (UROLOGICAL SUPPLIES) ×3 IMPLANT
BLADE SURG 10 STRL SS (BLADE) ×4 IMPLANT
BLADE SURG 15 STRL LF DISP TIS (BLADE) ×2 IMPLANT
BLADE SURG 15 STRL SS (BLADE) ×3
CANISTER SUCTION 2500CC (MISCELLANEOUS) ×6 IMPLANT
CATH FOLEY 2WAY SLVR  5CC 16FR (CATHETERS) ×1
CATH FOLEY 2WAY SLVR 5CC 16FR (CATHETERS) ×2 IMPLANT
CATH ROBINSON RED A/P 16FR (CATHETERS) ×1 IMPLANT
COVER TABLE BACK 60X90 (DRAPES) ×6 IMPLANT
DRAPE CAMERA CLOSED 9X96 (DRAPES) ×3 IMPLANT
DRAPE LG THREE QUARTER DISP (DRAPES) ×3 IMPLANT
DRAPE UNDERBUTTOCKS STRL (DRAPE) ×3 IMPLANT
DRSG TEGADERM 2-3/8X2-3/4 SM (GAUZE/BANDAGES/DRESSINGS) ×1 IMPLANT
ELECT LIGASURE LONG (ELECTRODE) ×1 IMPLANT
ELECT REM PT RETURN 9FT ADLT (ELECTROSURGICAL) ×3
ELECTRODE REM PT RTRN 9FT ADLT (ELECTROSURGICAL) ×2 IMPLANT
GLOVE BIO SURGEON STRL SZ 6 (GLOVE) ×2 IMPLANT
GLOVE BIO SURGEON STRL SZ 6.5 (GLOVE) ×1 IMPLANT
GLOVE BIO SURGEON STRL SZ7.5 (GLOVE) ×9 IMPLANT
GLOVE BIOGEL PI IND STRL 6.5 (GLOVE) IMPLANT
GLOVE BIOGEL PI IND STRL 7.5 (GLOVE) IMPLANT
GLOVE BIOGEL PI INDICATOR 6.5 (GLOVE) ×2
GLOVE BIOGEL PI INDICATOR 7.5 (GLOVE) ×1
GLOVE ECLIPSE 6.5 STRL STRAW (GLOVE) ×3 IMPLANT
GLOVE SURG SS PI 7.5 STRL IVOR (GLOVE) ×1 IMPLANT
GOWN STRL REUS W/ TWL LRG LVL3 (GOWN DISPOSABLE) IMPLANT
GOWN STRL REUS W/ TWL XL LVL3 (GOWN DISPOSABLE) IMPLANT
GOWN STRL REUS W/TWL LRG LVL3 (GOWN DISPOSABLE) ×3
GOWN STRL REUS W/TWL XL LVL3 (GOWN DISPOSABLE) ×9
HOLDER FOLEY CATH W/STRAP (MISCELLANEOUS) ×3 IMPLANT
NDL HYPO 25X1 1.5 SAFETY (NEEDLE) ×2 IMPLANT
NDL INSUFFLATION 14GA 120MM (NEEDLE) IMPLANT
NDL SPNL 22GX3.5 QUINCKE BK (NEEDLE) ×2 IMPLANT
NEEDLE HYPO 25X1 1.5 SAFETY (NEEDLE) ×3 IMPLANT
NEEDLE INSUFFLATION 14GA 120MM (NEEDLE) ×3 IMPLANT
NEEDLE SPNL 22GX3.5 QUINCKE BK (NEEDLE) ×3 IMPLANT
NS IRRIG 500ML POUR BTL (IV SOLUTION) ×3 IMPLANT
PACK BASIN DAY SURGERY FS (CUSTOM PROCEDURE TRAY) ×3 IMPLANT
PAD OB MATERNITY 4.3X12.25 (PERSONAL CARE ITEMS) ×3 IMPLANT
PAD PREP 24X48 CUFFED NSTRL (MISCELLANEOUS) ×3 IMPLANT
PENCIL BUTTON HOLSTER BLD 10FT (ELECTRODE) ×3 IMPLANT
SEALER TISSUE G2 CVD JAW 45CM (ENDOMECHANICALS) ×1 IMPLANT
SET IRRIG TUBING LAPAROSCOPIC (IRRIGATION / IRRIGATOR) ×3 IMPLANT
SHEET LAVH (DRAPES) ×3 IMPLANT
SOLUTION ANTI FOG 6CC (MISCELLANEOUS) ×3 IMPLANT
SPONGE GAUZE 2X2 8PLY STRL LF (GAUZE/BANDAGES/DRESSINGS) ×1 IMPLANT
SPONGE LAP 4X18 X RAY DECT (DISPOSABLE) ×3 IMPLANT
STRIP CLOSURE SKIN 1/4X4 (GAUZE/BANDAGES/DRESSINGS) ×1 IMPLANT
SUT MNCRL 0 MO-4 VIOLET 18 CR (SUTURE) ×6 IMPLANT
SUT MNCRL 0 VIOLET 6X18 (SUTURE) ×2 IMPLANT
SUT MONOCRYL 0 6X18 (SUTURE) ×1
SUT MONOCRYL 0 MO 4 18  CR/8 (SUTURE) ×1
SUT VIC AB 3-0 PS2 18 (SUTURE) ×3
SUT VIC AB 3-0 PS2 18XBRD (SUTURE) ×2 IMPLANT
SYR BULB IRRIGATION 50ML (SYRINGE) ×3 IMPLANT
SYR CONTROL 10ML LL (SYRINGE) ×6 IMPLANT
SYRINGE 10CC LL (SYRINGE) ×3 IMPLANT
TOWEL OR 17X24 6PK STRL BLUE (TOWEL DISPOSABLE) ×6 IMPLANT
TRAY DSU PREP LF (CUSTOM PROCEDURE TRAY) ×3 IMPLANT
TROCAR Z-THREAD BLADED 11X100M (TROCAR) ×3 IMPLANT
TROCAR Z-THREAD BLADED 5X100MM (TROCAR) ×5 IMPLANT
TUBE CONNECTING 12X1/4 (SUCTIONS) ×6 IMPLANT
TUBING INSUFFLATION 10FT LAP (TUBING) ×3 IMPLANT
WATER STERILE IRR 500ML POUR (IV SOLUTION) ×3 IMPLANT
YANKAUER SUCT BULB TIP NO VENT (SUCTIONS) ×3 IMPLANT

## 2014-11-01 NOTE — Anesthesia Preprocedure Evaluation (Addendum)
Anesthesia Evaluation  Patient identified by MRN, date of birth, ID band Patient awake    Reviewed: Allergy & Precautions, H&P , NPO status , Patient's Chart, lab work & pertinent test results  Airway Mallampati: III  TM Distance: <3 FB Neck ROM: Full    Dental no notable dental hx.    Pulmonary neg pulmonary ROS,  breath sounds clear to auscultation  Pulmonary exam normal       Cardiovascular negative cardio ROS  Rhythm:Regular Rate:Normal     Neuro/Psych negative neurological ROS  negative psych ROS   GI/Hepatic Neg liver ROS, GERD-  Medicated,  Endo/Other  obesity  Renal/GU negative Renal ROS  negative genitourinary   Musculoskeletal negative musculoskeletal ROS (+)   Abdominal   Peds negative pediatric ROS (+)  Hematology negative hematology ROS (+)   Anesthesia Other Findings   Reproductive/Obstetrics negative OB ROS                            Anesthesia Physical Anesthesia Plan  ASA: II  Anesthesia Plan: General   Post-op Pain Management:    Induction: Intravenous  Airway Management Planned: Oral ETT  Additional Equipment:   Intra-op Plan:   Post-operative Plan: Extubation in OR  Informed Consent: I have reviewed the patients History and Physical, chart, labs and discussed the procedure including the risks, benefits and alternatives for the proposed anesthesia with the patient or authorized representative who has indicated his/her understanding and acceptance.   Dental advisory given  Plan Discussed with: CRNA and Surgeon  Anesthesia Plan Comments:         Anesthesia Quick Evaluation

## 2014-11-01 NOTE — Anesthesia Postprocedure Evaluation (Signed)
  Anesthesia Post-op Note  Patient: Kathleen Wise  Procedure(s) Performed: Procedure(s) (LRB): LAPAROSCOPIC ASSISTED VAGINAL HYSTERECTOMY (N/A) SALPINGO OOPHORECTOMY (Bilateral)  Patient Location: PACU  Anesthesia Type: General  Level of Consciousness: awake and alert   Airway and Oxygen Therapy: Patient Spontanous Breathing  Post-op Pain: mild  Post-op Assessment: Post-op Vital signs reviewed, Patient's Cardiovascular Status Stable, Respiratory Function Stable, Patent Airway and No signs of Nausea or vomiting  Last Vitals:  Filed Vitals:   11/01/14 0930  BP: 139/67  Pulse: 78  Temp:   Resp: 9    Post-op Vital Signs: stable   Complications: No apparent anesthesia complications

## 2014-11-01 NOTE — Progress Notes (Signed)
Spoke with MD Nori Riis and notified him of the patient complaining of itching, order given for benadryl 25-50 mg TID as needed for itching and order received to remove patient's foley catheter tomorrow 11-02-2014 15:10pm Neta Mends

## 2014-11-01 NOTE — Anesthesia Procedure Notes (Signed)
Procedure Name: Intubation Date/Time: 11/01/2014 7:34 AM Performed by: Mechele Claude Pre-anesthesia Checklist: Patient identified, Emergency Drugs available, Suction available and Patient being monitored Patient Re-evaluated:Patient Re-evaluated prior to inductionOxygen Delivery Method: Circle System Utilized Preoxygenation: Pre-oxygenation with 100% oxygen Intubation Type: IV induction Ventilation: Mask ventilation without difficulty Laryngoscope Size: Mac and 4 Tube type: Oral Tube size: 7.0 mm Number of attempts: 1 Airway Equipment and Method: stylet and oral airway Placement Confirmation: ETT inserted through vocal cords under direct vision,  positive ETCO2 and breath sounds checked- equal and bilateral Secured at: 21 cm Tube secured with: Tape Dental Injury: Teeth and Oropharynx as per pre-operative assessment

## 2014-11-01 NOTE — Transfer of Care (Signed)
Immediate Anesthesia Transfer of Care Note  Patient: Kathleen Wise  Procedure(s) Performed: Procedure(s) (LRB): LAPAROSCOPIC ASSISTED VAGINAL HYSTERECTOMY (N/A) SALPINGO OOPHORECTOMY (Bilateral)  Patient Location: PACU  Anesthesia Type: General  Level of Consciousness: awake, alert  and oriented  Airway & Oxygen Therapy: Patient Spontanous Breathing and Patient connected to face mask oxygen  Post-op Assessment: Report given to PACU RN and Post -op Vital signs reviewed and stable  Post vital signs: Reviewed and stable  Complications: No apparent anesthesia complications

## 2014-11-01 NOTE — Plan of Care (Signed)
Problem: Phase I Progression Outcomes Goal: VS, stable, temp < 100.4 degrees F Outcome: Progressing

## 2014-11-01 NOTE — Progress Notes (Signed)
MD called to given me an order for Dilaudid 1-2 mg orally every 4 hours as needed for pain, order entered [place], [place] 11-01-2014 5:46 PM

## 2014-11-02 ENCOUNTER — Encounter (HOSPITAL_BASED_OUTPATIENT_CLINIC_OR_DEPARTMENT_OTHER): Payer: Self-pay | Admitting: Obstetrics & Gynecology

## 2014-11-02 DIAGNOSIS — D259 Leiomyoma of uterus, unspecified: Secondary | ICD-10-CM | POA: Diagnosis not present

## 2014-11-02 LAB — CBC
HCT: 40.3 % (ref 36.0–46.0)
HEMOGLOBIN: 13.3 g/dL (ref 12.0–15.0)
MCH: 32.4 pg (ref 26.0–34.0)
MCHC: 33 g/dL (ref 30.0–36.0)
MCV: 98.3 fL (ref 78.0–100.0)
Platelets: 228 10*3/uL (ref 150–400)
RBC: 4.1 MIL/uL (ref 3.87–5.11)
RDW: 13.1 % (ref 11.5–15.5)
WBC: 10.6 10*3/uL — ABNORMAL HIGH (ref 4.0–10.5)

## 2014-11-02 MED ORDER — HYDROMORPHONE HCL 2 MG PO TABS: 1.0000 mg | ORAL_TABLET | ORAL | Status: DC | PRN

## 2014-11-02 NOTE — Plan of Care (Signed)
Problem: Consults Goal: GYN POST OP OBSV 2 days Patient Education (See Patient Education module for education specifics.)  Outcome: Completed/Met Date Met:  11/02/14 Goal: Skin Care Protocol Initiated - if Braden Score 18 or less If consults are not indicated, leave blank or document N/A  Outcome: Not Applicable Date Met:  18/40/37 Goal: Nutrition Consult-if indicated Outcome: Not Applicable Date Met:  54/36/06 Goal: Diabetes Guidelines if Diabetic/Glucose > 140 If diabetic or lab glucose is > 140 mg/dl - Initiate Diabetes/Hyperglycemia Guidelines & Document Interventions  Outcome: Not Applicable Date Met:  77/03/40  Problem: Phase I Progression Outcomes Goal: Pain controlled with appropriate interventions Outcome: Completed/Met Date Met:  11/02/14 Goal: Admission history reviewed Outcome: Completed/Met Date Met:  11/02/14 Goal: Dangle/OOB as tolerated per MD order Outcome: Completed/Met Date Met:  11/02/14 Goal: VS, stable, temp < 100.4 degrees F Outcome: Completed/Met Date Met:  11/02/14 Goal: I & O every 4 hrs or as ordered Outcome: Completed/Met Date Met:  11/02/14 Goal: IS, TCDB as ordered Outcome: Completed/Met Date Met:  11/02/14 Goal: Other Phase I Outcomes/Goals Outcome: Completed/Met Date Met:  11/02/14  Problem: Phase II Progression Outcomes Goal: Pain controlled on oral analgesia Outcome: Completed/Met Date Met:  11/02/14 Goal: Progress activity as tolerated unless otherwise ordered Outcome: Completed/Met Date Met:  11/02/14 Goal: Afebrile, VS remain stable Outcome: Completed/Met Date Met:  11/02/14 Goal: Foley discontinued Outcome: Not Applicable Date Met:  35/24/81 Goal: Voiding trials/Bladder training within 48 hrs Outcome: Not Applicable Date Met:  85/90/93 Goal: Incision/dressings dry and intact Outcome: Completed/Met Date Met:  11/02/14 Goal: Remove staples if indicated/incision care Outcome: Completed/Met Date Met:  11/02/14 Goal: Other Phase II  Outcomes/Goals Outcome: Completed/Met Date Met:  11/02/14  Problem: Discharge Progression Outcomes Goal: Barriers To Progression Addressed/Resolved Outcome: Completed/Met Date Met:  11/02/14 Goal: Discharge plan in place and appropriate Outcome: Completed/Met Date Met:  11/02/14 Goal: Pain controlled with appropriate interventions Outcome: Completed/Met Date Met:  11/02/14 Goal: Hemodynamically stable Outcome: Completed/Met Date Met:  10/22/61 Goal: Complications resolved/controlled Outcome: Completed/Met Date Met:  11/02/14 Goal: Tolerating diet Outcome: Completed/Met Date Met:  11/02/14 Goal: Discontinue staples (if applicable) Outcome: Not Applicable Date Met:  44/69/50 Goal: Activity appropriate for discharge plan Outcome: Completed/Met Date Met:  11/02/14 Goal: Other Discharge Outcomes/Goals Outcome: Completed/Met Date Met:  11/02/14

## 2014-11-02 NOTE — Op Note (Signed)
NAMERASHIDA, Kathleen Wise                  ACCOUNT NO.:  [id number]  MEDICAL RECORD NO.:  [id number]  LOCATION:  1536                         FACILITY:  [facility]  PHYSICIAN:  Maisie Fus, M.D.   DATE OF BIRTH:  [birth year]  DATE OF PROCEDURE:  11/01/2014 DATE OF DISCHARGE:                              OPERATIVE REPORT   PREOPERATIVE DIAGNOSES:  Uterine leiomyomata, dysfunctional uterine bleeding, pelvic pain.  POSTOPERATIVE DIAGNOSES:  Uterine leiomyomata, dysfunctional uterine bleeding, pelvic pain.  OPERATIVE PROCEDURE:  Laparoscopic vaginal hysterectomy, bilateral salpingo-oophorectomy.  SURGEON:  Maisie Fus, M.D.  ASSISTANT:  Erik Obey. Helane Rima, M.D.  ANESTHESIA:  General endotracheal.  INTRAOPERATIVE COMPLICATIONS:  None.  ESTIMATED INTRAOPERATIVE BLOOD LOSS:  250 mL.  Details of the present illness recorded in the admission note.  DESCRIPTION OF THE PROCEDURE:  The patient was admitted on the morning of surgery.  She was given 900 mg of clindamycin IV.  She was placed in PAS hose.  She was brought to the operating room, placed under adequate general endotracheal anesthesia.  She was placed in the dorsal lithotomy position using the [employer] system.  Betadine prep of abdomen, perineum, and vagina was carried out with Betadine scrub followed by Betadine solution.  Bladder was evacuated with sterile technique in a Robinson catheter.  Hulka tenaculum was attached to the cervix under direct visualization.  Sterile drapes were applied.  Two small incisions were made in the abdomen, 1 at the umbilicus and 1 just above the symphysis pubis.  Through the upper incision, a Veress needle was introduced while elevating the anterior abdominal wall manually. Pneumoperitoneum was allowed to accumulate with carbon dioxide gas.  The 10/11 __________ disposable trocar was introduced through the umbilical incision while elevating the anterior abdominal wall.  Direct  inspection revealed adequate placement with no evidence of injury on entry. Scanning inspection of the upper abdomen revealed no apparent abnormalities.  No visible abnormalities were noted except for the pelvic findings which was remarkable for uterine leiomyomata.  Tubes and ovaries were appropriate for age and normal.  The Ethicon tripolar device was used through the operating channel of the laparoscope. __________ clamp was used through the 5 mm port to expose and elevate the adnexa.  Using the Ethicon device, progressive pedicles were used to develop the infundibulopelvic ligaments, round ligaments, upper broad ligaments to the level down near the uterine arteries.  This was performed similarly on each side.  Attention was then turned to the vaginal portion of the procedure.  Posterior weighted vaginal retractor was placed.  Hulka tenaculum was replaced with the [facility] tenaculum. Deaver retractors were used for anterior and lateral exposure. Colpotomy incision was made while tenting the mucosa posterior to the cervix, which entered the peritoneal cavity.  The cervix was circumscribed with the scalpel to release the mucosa.  LigaSure device was then used to seal and divide uterosacral pedicles and bladder pillars.  Bladder was carefully advanced off the cervix and lower segment and the anterior peritoneum entered.  Cardinal ligament pedicles were sealed and divided using LigaSure as were the uterine artery pedicles on each side.  This allowed delivery of the uterus, tubes, and ovaries  through the vaginal introitus.  Two peritoneal bands were then sealed and divided to completely release the specimen.  Angles of the vagina were grasped with Allis' and suture ligature of 0 Monocryl in a mattress pattern were placed to tack the uterosacrals to the vaginal angles.  The posterior peritoneum was closed with an interrupted 0 Monocryl suture from uterosacral to peritoneum.  The uterosacral  cuff was closed vertically with figure-of-eight suture of Monocryl.  Foley catheter was placed.  Prompt spillage of clear urine was noted. Reinspection was carried out laparoscopically using the __________ system for irrigation.  Hemostasis was confirmed to be complete and reduced intraabdominal pressure and under irrigation.  Small amount of clotted material and irrigation was removed.  Gas was allowed to escape from the abdomen.  The incisions were anesthetized with 0.25% plain Marcaine and closed with interrupted subcuticular sutures of 3-0 Vicryl. Steri-Strips were applied in the lower incision, sterile dressing to the umbilical incision.  The patient tolerated the procedure well.  She was awakened, taken to the recovery room in good condition.     Maisie Fus, M.D.     WRN/MEDQ  D:  11/01/2014  T:  11/01/2014  Job:  [id number]

## 2015-06-02 ENCOUNTER — Ambulatory Visit (INDEPENDENT_AMBULATORY_CARE_PROVIDER_SITE_OTHER): Payer: 59 | Admitting: Urology

## 2015-06-02 DIAGNOSIS — R312 Other microscopic hematuria: Secondary | ICD-10-CM

## 2015-12-18 MED FILL — CLINDAMYCIN PH 1% GEL: 1 | 30 days supply | Qty: 60 | Fill #3

## 2015-12-18 MED FILL — ESCITALOPRAM 10 MG TABLET: 10 | 30 days supply | Qty: 30 | Fill #0

## 2015-12-18 MED FILL — MINOCYCLINE 100 MG CAPSULE: 100 | 30 days supply | Qty: 30 | Fill #3

## 2015-12-18 MED FILL — BUPROPION HCL XL 300 MG TAB: 300 | 30 days supply | Qty: 30 | Fill #0

## 2016-01-03 DIAGNOSIS — R42 Dizziness and giddiness: Secondary | ICD-10-CM | POA: Diagnosis not present

## 2016-01-03 DIAGNOSIS — R319 Hematuria, unspecified: Secondary | ICD-10-CM | POA: Diagnosis not present

## 2016-01-03 DIAGNOSIS — E559 Vitamin D deficiency, unspecified: Secondary | ICD-10-CM | POA: Diagnosis not present

## 2016-01-03 DIAGNOSIS — Z0000 Encounter for general adult medical examination without abnormal findings: Secondary | ICD-10-CM | POA: Diagnosis not present

## 2016-01-03 DIAGNOSIS — E669 Obesity, unspecified: Secondary | ICD-10-CM | POA: Diagnosis not present

## 2016-01-03 DIAGNOSIS — E782 Mixed hyperlipidemia: Secondary | ICD-10-CM | POA: Diagnosis not present

## 2016-01-03 DIAGNOSIS — Z658 Other specified problems related to psychosocial circumstances: Secondary | ICD-10-CM | POA: Diagnosis not present

## 2016-01-03 DIAGNOSIS — K219 Gastro-esophageal reflux disease without esophagitis: Secondary | ICD-10-CM | POA: Diagnosis not present

## 2016-01-03 DIAGNOSIS — J309 Allergic rhinitis, unspecified: Secondary | ICD-10-CM | POA: Diagnosis not present

## 2016-01-03 DIAGNOSIS — L719 Rosacea, unspecified: Secondary | ICD-10-CM | POA: Diagnosis not present

## 2016-01-17 MED FILL — CLINDAMYCIN PH 1% GEL: 1 | 30 days supply | Qty: 60 | Fill #4

## 2016-01-17 MED FILL — PRAVASTATIN SODIUM 20 MG TA: 20 | 90 days supply | Qty: 90 | Fill #0

## 2016-01-18 MED FILL — ESTRADIOL 0.1 MG PATCH: 0.1 | 84 days supply | Qty: 24 | Fill #0

## 2016-01-24 MED FILL — ESCITALOPRAM 10 MG TABLET: 10 | 30 days supply | Qty: 30 | Fill #0

## 2016-01-24 MED FILL — BUPROPION HCL XL 300 MG TAB: 300 | 30 days supply | Qty: 30 | Fill #0

## 2016-01-31 MED FILL — MINOCYCLINE 100 MG CAPSULE: 100 | 30 days supply | Qty: 30 | Fill #4

## 2016-02-06 MED FILL — clonazePAM 1 MG TABS: 1 | 30 days supply | Qty: 30 | Fill #0

## 2016-02-26 MED FILL — BUPROPION HCL XL 300 MG TAB: 300 | 30 days supply | Qty: 30 | Fill #1

## 2016-02-26 MED FILL — ESCITALOPRAM 10 MG TABLET: 10 | 30 days supply | Qty: 30 | Fill #1

## 2016-03-28 MED FILL — MINOCYCLINE 100 MG CAPSULE: 100 | 30 days supply | Qty: 30 | Fill #5

## 2016-03-28 MED FILL — BUPROPION HCL XL 300 MG TAB: 300 | 30 days supply | Qty: 30 | Fill #2

## 2016-04-02 MED FILL — clonazePAM 1 MG TABS: 1 | 30 days supply | Qty: 30 | Fill #0

## 2016-04-10 DIAGNOSIS — Z01419 Encounter for gynecological examination (general) (routine) without abnormal findings: Secondary | ICD-10-CM | POA: Diagnosis not present

## 2016-04-10 DIAGNOSIS — Z6829 Body mass index (BMI) 29.0-29.9, adult: Secondary | ICD-10-CM | POA: Diagnosis not present

## 2016-04-10 DIAGNOSIS — Z1231 Encounter for screening mammogram for malignant neoplasm of breast: Secondary | ICD-10-CM | POA: Diagnosis not present

## 2016-04-10 MED FILL — ESTRADIOL 0.1 MG PATCH: 0.1 | 84 days supply | Qty: 24 | Fill #0

## 2016-04-15 MED FILL — CLINDAMYCIN PH 1% GEL: 1 | 30 days supply | Qty: 60 | Fill #5

## 2016-05-06 MED FILL — PRAVASTATIN SODIUM 20 MG TA: 20 | 90 days supply | Qty: 90 | Fill #1

## 2016-05-06 MED FILL — MINOCYCLINE 100 MG CAPSULE: 100 | 30 days supply | Qty: 30 | Fill #6

## 2016-05-06 MED FILL — BUPROPION HCL XL 300 MG TAB: 300 | 90 days supply | Qty: 90 | Fill #0

## 2016-06-19 MED FILL — clonazePAM 1 MG TABS: 1 | 30 days supply | Qty: 30 | Fill #0

## 2016-06-19 MED FILL — CLINDAMYCIN PH 1% GEL: 1 | 90 days supply | Qty: 60 | Fill #0

## 2016-07-10 MED FILL — ESTRADIOL 0.1 MG PATCH: 0.1 | 84 days supply | Qty: 24 | Fill #0

## 2016-07-15 ENCOUNTER — Telehealth: Payer: 59 | Admitting: Family Medicine

## 2016-07-15 DIAGNOSIS — J0120 Acute ethmoidal sinusitis, unspecified: Secondary | ICD-10-CM

## 2016-07-15 MED ORDER — AZITHROMYCIN 250 MG PO TABS: ORAL_TABLET | ORAL | 0 refills | Status: DC

## 2016-07-15 MED FILL — AZITHROMYCIN 250 MG TABLET: 250 | 5 days supply | Qty: 6 | Fill #0

## 2016-07-15 NOTE — Progress Notes (Signed)
We are sorry that you are not feeling well.  Here is how we plan to help!  Based on what you have shared with me it looks like you have sinusitis.  Sinusitis is inflammation and infection in the sinus cavities of the head.  Based on your presentation I believe you most likely have Acute Viral Sinusitis.This is an infection most likely caused by a virus. There is not specific treatment for viral sinusitis other than to help you with the symptoms until the infection runs its course.  You may use an oral decongestant such as Mucinex D or if you have glaucoma or high blood pressure use plain Mucinex. Saline nasal spray help and can safely be used as often as needed for congestion, I have prescribed: Z pack increase daily fluids  Some authorities believe that zinc sprays or the use of Echinacea may shorten the course of your symptoms.  Sinus infections are not as easily transmitted as other respiratory infection, however we still recommend that you avoid close contact with loved ones, especially the very young and elderly.  Remember to wash your hands thoroughly throughout the day as this is the number one way to prevent the spread of infection!  Home Care:  Only take medications as instructed by your medical team.  Complete the entire course of an antibiotic.  Do not take these medications with alcohol.  A steam or ultrasonic humidifier can help congestion.  You can place a towel over your head and breathe in the steam from hot water coming from a faucet.  Avoid close contacts especially the very young and the elderly.  Cover your mouth when you cough or sneeze.  Always remember to wash your hands.  Get Help Right Away If:  You develop worsening fever or sinus pain.  You develop a severe head ache or visual changes.  Your symptoms persist after you have completed your treatment plan.  Make sure you  Understand these instructions.  Will watch your condition.  Will get help right away  if you are not doing well or get worse.  Your e-visit answers were reviewed by a board certified advanced clinical practitioner to complete your personal care plan.  Depending on the condition, your plan could have included both over the counter or prescription medications.  If there is a problem please reply  once you have received a response from your provider.  Your safety is important to [place].  If you have drug allergies check your prescription carefully.    You can use MyChart to ask questions about today's visit, request a non-urgent call back, or ask for a work or school excuse for 24 hours related to this e-Visit. If it has been greater than 24 hours you will need to follow up with your provider, or enter a new e-Visit to address those concerns.  You will get an e-mail in the next two days asking about your experience.  I hope that your e-visit has been valuable and will speed your recovery. Thank you for using e-visits.

## 2016-07-18 ENCOUNTER — Telehealth: Payer: 59 | Admitting: Physician Assistant

## 2016-07-18 DIAGNOSIS — J0120 Acute ethmoidal sinusitis, unspecified: Secondary | ICD-10-CM | POA: Diagnosis not present

## 2016-07-18 MED ORDER — AZITHROMYCIN 250 MG PO TABS: ORAL_TABLET | ORAL | 0 refills | Status: DC

## 2016-07-18 MED FILL — AZITHROMYCIN 250 MG TABLET: 250 | 2 days supply | Qty: 2 | Fill #0

## 2016-07-18 NOTE — Progress Notes (Signed)
Patient being treated via e-visit this past week for sinusitis. Lost last two tablets of Zithromax. Refill sent. Patient should not be charged.  Leeanne Rio, PA-C

## 2016-08-06 MED FILL — clonazePAM 1 MG TABS: 1 | 30 days supply | Qty: 30 | Fill #1

## 2016-08-06 MED FILL — PANTOPRAZOLE SOD DR 40 MG T: 40 | 90 days supply | Qty: 36 | Fill #0

## 2016-08-06 MED FILL — PRAVASTATIN SODIUM 20 MG TA: 20 | 90 days supply | Qty: 90 | Fill #2

## 2016-08-06 MED FILL — MINOCYCLINE 100 MG CAPSULE: 100 | 30 days supply | Qty: 30 | Fill #0

## 2016-08-07 MED FILL — BUPROPION HCL XL 300 MG TAB: 300 | 90 days supply | Qty: 90 | Fill #0

## 2016-08-19 DIAGNOSIS — H40011 Open angle with borderline findings, low risk, right eye: Secondary | ICD-10-CM | POA: Diagnosis not present

## 2016-08-19 DIAGNOSIS — H40012 Open angle with borderline findings, low risk, left eye: Secondary | ICD-10-CM | POA: Diagnosis not present

## 2016-08-26 MED FILL — CLINDAMYCIN PH 1% GEL: 1 | 90 days supply | Qty: 60 | Fill #1

## 2016-09-16 MED FILL — MINOCYCLINE 100 MG CAPSULE: 100 | 30 days supply | Qty: 30 | Fill #1

## 2016-10-02 MED FILL — clonazePAM 1 MG TABS: 1 | 30 days supply | Qty: 30 | Fill #0

## 2016-10-02 MED FILL — ESTRADIOL 0.1 MG PATCH: 0.1 | 84 days supply | Qty: 24 | Fill #1

## 2016-10-29 MED FILL — MINOCYCLINE 100 MG CAPSULE: 100 | 30 days supply | Qty: 30 | Fill #2

## 2016-10-29 MED FILL — PANTOPRAZOLE SOD DR 40 MG T: 40 | 90 days supply | Qty: 36 | Fill #1

## 2016-10-30 MED FILL — clonazePAM 1 MG TABS: 1 | 30 days supply | Qty: 30 | Fill #0

## 2016-11-04 DIAGNOSIS — G4700 Insomnia, unspecified: Secondary | ICD-10-CM | POA: Diagnosis not present

## 2016-11-07 MED FILL — traZODone HCL 50 MG TABS: 50 | 30 days supply | Qty: 60 | Fill #0

## 2016-11-12 MED FILL — PRAVASTATIN SODIUM 20 MG TA: 20 | 90 days supply | Qty: 90 | Fill #0

## 2016-11-12 MED FILL — CLINDAMYCIN PH 1% GEL: 1 | 30 days supply | Qty: 60 | Fill #0

## 2016-11-12 MED FILL — BUPROPION HCL XL 300 MG TAB: 300 | 90 days supply | Qty: 90 | Fill #1

## 2016-12-10 DIAGNOSIS — D2262 Melanocytic nevi of left upper limb, including shoulder: Secondary | ICD-10-CM | POA: Diagnosis not present

## 2016-12-10 DIAGNOSIS — D2271 Melanocytic nevi of right lower limb, including hip: Secondary | ICD-10-CM | POA: Diagnosis not present

## 2016-12-10 DIAGNOSIS — L821 Other seborrheic keratosis: Secondary | ICD-10-CM | POA: Diagnosis not present

## 2016-12-10 DIAGNOSIS — D2272 Melanocytic nevi of left lower limb, including hip: Secondary | ICD-10-CM | POA: Diagnosis not present

## 2016-12-10 DIAGNOSIS — L718 Other rosacea: Secondary | ICD-10-CM | POA: Diagnosis not present

## 2016-12-10 MED FILL — DOXYCYCLINE HYCLATE 100 MG: 100 | 30 days supply | Qty: 30 | Fill #0

## 2016-12-30 MED FILL — ESTRADIOL 0.1 MG PATCH: 0.1 | 84 days supply | Qty: 24 | Fill #2

## 2017-01-01 MED FILL — CLINDAMYCIN PH 1% GEL: 1 | 30 days supply | Qty: 60 | Fill #1

## 2017-01-07 MED FILL — DOXYCYCLINE HYCLATE 100 MG: 100 | 30 days supply | Qty: 30 | Fill #1

## 2017-01-21 MED FILL — PANTOPRAZOLE SOD DR 40 MG T: 40 | 90 days supply | Qty: 36 | Fill #2

## 2017-01-23 DIAGNOSIS — H04123 Dry eye syndrome of bilateral lacrimal glands: Secondary | ICD-10-CM | POA: Diagnosis not present

## 2017-01-23 MED FILL — FLUOROMETHOLONE 0.1% DROPS: 0.1 | 7 days supply | Qty: 5 | Fill #0

## 2017-01-24 DIAGNOSIS — M1811 Unilateral primary osteoarthritis of first carpometacarpal joint, right hand: Secondary | ICD-10-CM | POA: Diagnosis not present

## 2017-01-24 DIAGNOSIS — M25531 Pain in right wrist: Secondary | ICD-10-CM | POA: Diagnosis not present

## 2017-02-04 MED FILL — traZODone HCL 50 MG TABS: 50 | 30 days supply | Qty: 60 | Fill #1

## 2017-02-07 DIAGNOSIS — R42 Dizziness and giddiness: Secondary | ICD-10-CM | POA: Diagnosis not present

## 2017-02-07 DIAGNOSIS — Z0000 Encounter for general adult medical examination without abnormal findings: Secondary | ICD-10-CM | POA: Diagnosis not present

## 2017-02-07 DIAGNOSIS — F439 Reaction to severe stress, unspecified: Secondary | ICD-10-CM | POA: Diagnosis not present

## 2017-02-07 DIAGNOSIS — L719 Rosacea, unspecified: Secondary | ICD-10-CM | POA: Diagnosis not present

## 2017-02-07 DIAGNOSIS — R319 Hematuria, unspecified: Secondary | ICD-10-CM | POA: Diagnosis not present

## 2017-02-07 DIAGNOSIS — J309 Allergic rhinitis, unspecified: Secondary | ICD-10-CM | POA: Diagnosis not present

## 2017-02-07 DIAGNOSIS — K219 Gastro-esophageal reflux disease without esophagitis: Secondary | ICD-10-CM | POA: Diagnosis not present

## 2017-02-07 DIAGNOSIS — E782 Mixed hyperlipidemia: Secondary | ICD-10-CM | POA: Diagnosis not present

## 2017-02-07 DIAGNOSIS — G4700 Insomnia, unspecified: Secondary | ICD-10-CM | POA: Diagnosis not present

## 2017-02-07 DIAGNOSIS — E669 Obesity, unspecified: Secondary | ICD-10-CM | POA: Diagnosis not present

## 2017-02-07 MED FILL — BUPROPION HCL XL 300 MG TAB: 300 | 90 days supply | Qty: 90 | Fill #0

## 2017-02-12 MED FILL — DOXYCYCLINE HYCLATE 100 MG: 100 | 30 days supply | Qty: 30 | Fill #2

## 2017-02-12 MED FILL — PRAVASTATIN SODIUM 20 MG TA: 20 | 90 days supply | Qty: 90 | Fill #1

## 2017-03-14 MED FILL — DOXYCYCLINE HYCLATE 100 MG: 100 | 30 days supply | Qty: 30 | Fill #3

## 2017-03-14 MED FILL — ESTRADIOL 0.1 MG PATCH: 0.1 | 84 days supply | Qty: 24 | Fill #3

## 2017-04-01 MED FILL — traZODone HCL 50 MG TABS: 50 | 30 days supply | Qty: 60 | Fill #2

## 2017-04-01 MED FILL — CLINDAMYCIN PH 1% GEL: 1 | 30 days supply | Qty: 60 | Fill #2

## 2017-04-07 MED FILL — PANTOPRAZOLE SOD DR 40 MG T: 40 | 90 days supply | Qty: 36 | Fill #3

## 2017-04-23 MED FILL — MINOCYCLINE 50 MG CAPSULE: 50 | 30 days supply | Qty: 60 | Fill #0

## 2017-05-20 MED FILL — ESTRADIOL 0.1 MG PATCH: 0.1 | 84 days supply | Qty: 24 | Fill #4

## 2017-05-20 MED FILL — PRAVASTATIN SODIUM 20 MG TA: 20 | 90 days supply | Qty: 90 | Fill #2

## 2017-05-20 MED FILL — CLINDAMYCIN PH 1% GEL: 1 | 30 days supply | Qty: 60 | Fill #3

## 2017-05-20 MED FILL — BUPROPION HCL XL 300 MG TAB: 300 | 90 days supply | Qty: 90 | Fill #1

## 2017-06-19 DIAGNOSIS — E559 Vitamin D deficiency, unspecified: Secondary | ICD-10-CM | POA: Diagnosis not present

## 2017-06-19 DIAGNOSIS — E782 Mixed hyperlipidemia: Secondary | ICD-10-CM | POA: Diagnosis not present

## 2017-06-19 DIAGNOSIS — Z0000 Encounter for general adult medical examination without abnormal findings: Secondary | ICD-10-CM | POA: Diagnosis not present

## 2017-06-26 ENCOUNTER — Telehealth: Payer: Self-pay | Admitting: Internal Medicine

## 2017-06-26 NOTE — Telephone Encounter (Signed)
TJ calling from Dr. Josetta Huddle office calling to check on status of referral faxed July 23rd (monday). Please call back to update.  Thank you,  -LL

## 2017-06-30 MED FILL — PANTOPRAZOLE SOD DR 40 MG T: 40 | 90 days supply | Qty: 36 | Fill #4

## 2017-06-30 MED FILL — traZODone HCL 50 MG TABS: 50 | 30 days supply | Qty: 60 | Fill #3

## 2017-07-07 NOTE — Telephone Encounter (Signed)
TJ calling from Dr. Josetta Huddle office calling to check on status of referral faxed July 23rd (monday). TJ resent this referral on July 31st.   Please call back to let her know if it was received.   Thank you,  -LL

## 2017-07-07 NOTE — Telephone Encounter (Signed)
If she call back her referral is being reviewed by Dr Cruzita Lederer

## 2017-07-11 MED FILL — MINOCYCLINE 50 MG CAPSULE: 50 | 30 days supply | Qty: 60 | Fill #1

## 2017-07-11 NOTE — Telephone Encounter (Signed)
TJ calling, advised referral is in review.  Ty,  -LL

## 2017-07-11 NOTE — Telephone Encounter (Signed)
Routing to you °

## 2017-07-31 MED FILL — PRAVASTATIN NA 40 MG TAB: 40 | 90 days supply | Qty: 90 | Fill #0

## 2017-08-01 DIAGNOSIS — L659 Nonscarring hair loss, unspecified: Secondary | ICD-10-CM | POA: Diagnosis not present

## 2017-08-14 DIAGNOSIS — Z6831 Body mass index (BMI) 31.0-31.9, adult: Secondary | ICD-10-CM | POA: Diagnosis not present

## 2017-08-14 DIAGNOSIS — Z1231 Encounter for screening mammogram for malignant neoplasm of breast: Secondary | ICD-10-CM | POA: Diagnosis not present

## 2017-08-14 DIAGNOSIS — Z01419 Encounter for gynecological examination (general) (routine) without abnormal findings: Secondary | ICD-10-CM | POA: Diagnosis not present

## 2017-08-14 MED FILL — CLINDAMYCIN PH 1% GEL: 1 | 30 days supply | Qty: 60 | Fill #0

## 2017-08-14 MED FILL — MINIVELLE 0.1 MG PATCH: 0.1 | 28 days supply | Qty: 8 | Fill #0

## 2017-08-21 DIAGNOSIS — H40013 Open angle with borderline findings, low risk, bilateral: Secondary | ICD-10-CM | POA: Diagnosis not present

## 2017-08-21 DIAGNOSIS — H5213 Myopia, bilateral: Secondary | ICD-10-CM | POA: Diagnosis not present

## 2017-08-25 DIAGNOSIS — E782 Mixed hyperlipidemia: Secondary | ICD-10-CM | POA: Diagnosis not present

## 2017-09-08 MED FILL — BUPROPION HCL XL 300 MG TAB: 300 | 90 days supply | Qty: 90 | Fill #2

## 2017-09-08 MED FILL — traZODone HCL 50 MG TABS: 50 | 30 days supply | Qty: 60 | Fill #4

## 2017-09-23 MED FILL — MINOCYCLINE 50 MG CAPSULE: 50 | 30 days supply | Qty: 60 | Fill #2

## 2017-09-23 MED FILL — PANTOPRAZOLE SOD DR 40 MG T: 40 | 90 days supply | Qty: 36 | Fill #0

## 2017-09-30 MED FILL — MINIVELLE 0.1 MG PATCH: 0.1 | 28 days supply | Qty: 8 | Fill #1

## 2017-10-02 DIAGNOSIS — M25561 Pain in right knee: Secondary | ICD-10-CM | POA: Diagnosis not present

## 2017-10-27 ENCOUNTER — Other Ambulatory Visit: Payer: Self-pay | Admitting: Internal Medicine

## 2017-10-27 DIAGNOSIS — M79661 Pain in right lower leg: Secondary | ICD-10-CM

## 2017-10-27 DIAGNOSIS — M79604 Pain in right leg: Secondary | ICD-10-CM | POA: Diagnosis not present

## 2017-10-28 ENCOUNTER — Ambulatory Visit
Admission: RE | Admit: 2017-10-28 | Discharge: 2017-10-28 | Disposition: A | Discharge status: Home / Self Care | Payer: 59 | Source: Ambulatory Visit | Attending: Internal Medicine | Admitting: Internal Medicine

## 2017-10-28 DIAGNOSIS — M79661 Pain in right lower leg: Secondary | ICD-10-CM | POA: Diagnosis not present

## 2017-11-05 MED FILL — PRAVASTATIN NA 40 MG TAB: 40 | 90 days supply | Qty: 90 | Fill #0

## 2017-11-05 MED FILL — ESTRADIOL 0.1 MG/24HR PTTW: 0.1 | 28 days supply | Qty: 8 | Fill #2

## 2017-11-19 MED FILL — MINOCYCLINE 50 MG CAPSULE: 50 | 30 days supply | Qty: 60 | Fill #0

## 2017-11-20 MED FILL — traZODone HCL 50 MG TABS: 50 | 30 days supply | Qty: 60 | Fill #0

## 2017-12-01 MED FILL — CLINDAMYCIN PH 1% GEL: 1 | 30 days supply | Qty: 60 | Fill #1

## 2017-12-01 MED FILL — ESTRADIOL 0.1 MG/24HR PTTW: 0.1 | 28 days supply | Qty: 8 | Fill #3

## 2017-12-01 MED FILL — BUPROPION HCL XL 300 MG TAB: 300 | 90 days supply | Qty: 90 | Fill #3

## 2017-12-18 DIAGNOSIS — D1801 Hemangioma of skin and subcutaneous tissue: Secondary | ICD-10-CM | POA: Diagnosis not present

## 2017-12-18 DIAGNOSIS — L821 Other seborrheic keratosis: Secondary | ICD-10-CM | POA: Diagnosis not present

## 2017-12-18 DIAGNOSIS — L280 Lichen simplex chronicus: Secondary | ICD-10-CM | POA: Diagnosis not present

## 2018-01-05 MED FILL — ESTRADIOL 0.1 MG/24HR PTTW: 0.1 | 28 days supply | Qty: 8 | Fill #4

## 2018-01-19 MED FILL — MINOCYCLINE 50 MG CAPSULE: 50 | 30 days supply | Qty: 60 | Fill #1

## 2018-01-19 MED FILL — traZODone HCL 50 MG TABS: 50 | 30 days supply | Qty: 60 | Fill #1

## 2018-01-19 MED FILL — PRAVASTATIN NA 40 MG TAB: 40 | 90 days supply | Qty: 90 | Fill #1

## 2018-01-28 MED FILL — CLINDAMYCIN PH 1% GEL: 1 | 30 days supply | Qty: 60 | Fill #2

## 2018-01-28 MED FILL — ESTRADIOL 0.1 MG/24HR PTTW: 0.1 | 28 days supply | Qty: 8 | Fill #5

## 2018-02-11 DIAGNOSIS — L659 Nonscarring hair loss, unspecified: Secondary | ICD-10-CM | POA: Diagnosis not present

## 2018-02-11 DIAGNOSIS — R3129 Other microscopic hematuria: Secondary | ICD-10-CM | POA: Diagnosis not present

## 2018-02-11 DIAGNOSIS — K219 Gastro-esophageal reflux disease without esophagitis: Secondary | ICD-10-CM | POA: Diagnosis not present

## 2018-02-11 DIAGNOSIS — Z79899 Other long term (current) drug therapy: Secondary | ICD-10-CM | POA: Diagnosis not present

## 2018-02-11 DIAGNOSIS — J9801 Acute bronchospasm: Secondary | ICD-10-CM | POA: Diagnosis not present

## 2018-02-11 DIAGNOSIS — E559 Vitamin D deficiency, unspecified: Secondary | ICD-10-CM | POA: Diagnosis not present

## 2018-02-11 DIAGNOSIS — E785 Hyperlipidemia, unspecified: Secondary | ICD-10-CM | POA: Diagnosis not present

## 2018-02-11 DIAGNOSIS — Z0000 Encounter for general adult medical examination without abnormal findings: Secondary | ICD-10-CM | POA: Diagnosis not present

## 2018-02-11 DIAGNOSIS — F439 Reaction to severe stress, unspecified: Secondary | ICD-10-CM | POA: Diagnosis not present

## 2018-02-11 MED FILL — PANTOPRAZOLE SOD DR 40 MG T: 40 | 28 days supply | Qty: 12 | Fill #0

## 2018-02-25 MED FILL — ESTRADIOL 0.1 MG/24HR PTTW: 0.1 | 28 days supply | Qty: 8 | Fill #6

## 2018-03-24 MED FILL — traZODone HCL 50 MG TABS: 50 | 30 days supply | Qty: 60 | Fill #2

## 2018-03-24 MED FILL — BUPROPION HCL XL 300 MG TAB: 300 | 90 days supply | Qty: 90 | Fill #0

## 2018-03-25 MED FILL — ESTRADIOL 0.1 MG/24HR PTTW: 0.1 | 28 days supply | Qty: 8 | Fill #7

## 2018-04-08 MED FILL — PANTOPRAZOLE SOD DR 40 MG T: 40 | 28 days supply | Qty: 12 | Fill #1

## 2018-04-08 MED FILL — MINOCYCLINE 50 MG CAPSULE: 50 | 30 days supply | Qty: 60 | Fill #2

## 2018-04-08 MED FILL — CLINDAMYCIN PH 1% GEL: 1 | 30 days supply | Qty: 60 | Fill #3

## 2018-04-21 MED FILL — ESTRADIOL 0.1 MG/24HR PTTW: 0.1 | 28 days supply | Qty: 8 | Fill #8

## 2018-05-12 MED FILL — PRAVASTATIN NA 40 MG TAB: 40 | 90 days supply | Qty: 90 | Fill #2

## 2018-05-12 MED FILL — traZODone HCL 50 MG TABS: 50 | 30 days supply | Qty: 60 | Fill #3

## 2018-05-20 MED FILL — ESTRADIOL 0.1 MG/24HR PTTW: 0.1 | 28 days supply | Qty: 8 | Fill #9

## 2018-06-11 MED FILL — ESTRADIOL 0.1 MG/24HR PTTW: 0.1 | 28 days supply | Qty: 8 | Fill #10

## 2018-06-11 MED FILL — PANTOPRAZOLE SOD DR 40 MG T: 40 | 28 days supply | Qty: 12 | Fill #2

## 2018-06-11 MED FILL — MINOCYCLINE 50 MG CAPSULE: 50 | 30 days supply | Qty: 60 | Fill #3

## 2018-06-11 MED FILL — CLINDAMYCIN PH 1% GEL: 1 | 30 days supply | Qty: 60 | Fill #4

## 2018-07-06 MED FILL — BUPROPION HCL XL 300 MG TAB: 300 | 90 days supply | Qty: 90 | Fill #1

## 2018-07-15 MED FILL — ESTRADIOL 0.1 MG/24HR PTTW: 0.1 | 28 days supply | Qty: 8 | Fill #11

## 2018-08-10 MED FILL — traZODone HCL 50 MG TABS: 50 | 30 days supply | Qty: 60 | Fill #4

## 2018-08-10 MED FILL — PRAVASTATIN NA 40 MG TAB: 40 | 90 days supply | Qty: 90 | Fill #0

## 2018-08-10 MED FILL — MINOCYCLINE 50 MG CAPSULE: 50 | 30 days supply | Qty: 60 | Fill #4

## 2018-08-10 MED FILL — CLINDAMYCIN PH 1% GEL: 1 | 30 days supply | Qty: 60 | Fill #5

## 2018-08-10 MED FILL — PANTOPRAZOLE SOD DR 40 MG T: 40 | 28 days supply | Qty: 12 | Fill #3

## 2018-08-12 MED FILL — ESTRADIOL 0.1 MG/24HR PTTW: 0.1 | 28 days supply | Qty: 8 | Fill #0

## 2018-08-25 DIAGNOSIS — H524 Presbyopia: Secondary | ICD-10-CM | POA: Diagnosis not present

## 2018-09-14 MED FILL — ESTRADIOL 0.1 MG/24HR PTTW: 0.1 | 28 days supply | Qty: 8 | Fill #0

## 2018-09-29 DIAGNOSIS — Z01419 Encounter for gynecological examination (general) (routine) without abnormal findings: Secondary | ICD-10-CM | POA: Diagnosis not present

## 2018-09-29 DIAGNOSIS — Z6832 Body mass index (BMI) 32.0-32.9, adult: Secondary | ICD-10-CM | POA: Diagnosis not present

## 2018-09-29 DIAGNOSIS — Z1231 Encounter for screening mammogram for malignant neoplasm of breast: Secondary | ICD-10-CM | POA: Diagnosis not present

## 2018-10-12 MED FILL — traZODone HCL 50 MG TABS: 50 | 30 days supply | Qty: 60 | Fill #5

## 2018-10-12 MED FILL — PANTOPRAZOLE SOD DR 40 MG T: 40 | 28 days supply | Qty: 12 | Fill #4

## 2018-10-12 MED FILL — BuPROPion HCL ER (XL) 300 M: 300 | 90 days supply | Qty: 90 | Fill #2

## 2018-10-12 MED FILL — ESTRADIOL 0.1 MG/24HR PTTW: 0.1 | 28 days supply | Qty: 8 | Fill #0

## 2018-10-12 MED FILL — MINOCYCLINE 50 MG CAPSULE: 50 | 30 days supply | Qty: 60 | Fill #5

## 2018-11-13 MED FILL — ESTRADIOL 0.1 MG/24HR PTTW: 0.1 | 28 days supply | Qty: 8 | Fill #1

## 2018-12-09 MED FILL — PRAVASTATIN NA 40 MG TAB: 40 | 90 days supply | Qty: 90 | Fill #1

## 2018-12-09 MED FILL — PANTOPRAZOLE SOD DR 40 MG T: 40 | 28 days supply | Qty: 12 | Fill #5

## 2018-12-14 MED FILL — ESTRADIOL 0.1 MG/24HR PTTW: 0.1 | 28 days supply | Qty: 8 | Fill #2

## 2018-12-15 MED FILL — traZODone HCL 50 MG TABS: 50 | 30 days supply | Qty: 60 | Fill #0

## 2018-12-16 MED FILL — MINOCYCLINE 50 MG CAPSULE: 50 | 30 days supply | Qty: 60 | Fill #0

## 2018-12-21 DIAGNOSIS — L718 Other rosacea: Secondary | ICD-10-CM | POA: Diagnosis not present

## 2018-12-21 DIAGNOSIS — D2262 Melanocytic nevi of left upper limb, including shoulder: Secondary | ICD-10-CM | POA: Diagnosis not present

## 2018-12-21 DIAGNOSIS — L821 Other seborrheic keratosis: Secondary | ICD-10-CM | POA: Diagnosis not present

## 2018-12-21 DIAGNOSIS — D1801 Hemangioma of skin and subcutaneous tissue: Secondary | ICD-10-CM | POA: Diagnosis not present

## 2018-12-21 MED FILL — metroNIDAZOLE 0.75 % GEL: 0.75 | 30 days supply | Qty: 45 | Fill #0

## 2019-01-08 MED FILL — buPROPion HCL ER (XL) 300 M: 300 | 90 days supply | Qty: 90 | Fill #3

## 2019-01-08 MED FILL — ESTRADIOL 0.1 MG/24HR PTTW: 0.1 | 28 days supply | Qty: 8 | Fill #3

## 2019-01-18 MED FILL — PANTOPRAZOLE SOD DR 40 MG T: 40 | 28 days supply | Qty: 12 | Fill #6

## 2019-01-18 MED FILL — MINOCYCLINE 50 MG CAPSULE: 50 | 30 days supply | Qty: 60 | Fill #1

## 2019-02-09 MED FILL — PANTOPRAZOLE SOD DR 40 MG T: 40 | 28 days supply | Qty: 12 | Fill #7

## 2019-02-09 MED FILL — traZODone HCL 50 MG TABS: 50 | 30 days supply | Qty: 60 | Fill #1 | Status: TO

## 2019-02-09 MED FILL — metroNIDAZOLE 0.75 % GEL: 0.75 | 30 days supply | Qty: 45 | Fill #1 | Status: TO

## 2019-02-15 MED FILL — MINOCYCLINE 50 MG CAPSULE: 50 | 30 days supply | Qty: 60 | Fill #2

## 2019-02-15 MED FILL — ESTRADIOL 0.1 MG/24HR PTTW: 0.1 | 28 days supply | Qty: 8 | Fill #4 | Status: TO

## 2019-03-12 MED FILL — ESTRADIOL 0.1 MG/24HR PTTW: 0.1 | 28 days supply | Qty: 8 | Fill #0

## 2019-03-12 MED FILL — traZODone HCL 50 MG TABS: 50 | 30 days supply | Qty: 60 | Fill #0

## 2019-03-15 MED FILL — PANTOPRAZOLE SOD DR 40 MG T: 40 | 28 days supply | Qty: 12 | Fill #0

## 2019-03-15 MED FILL — PRAVASTATIN NA 40 MG TAB: 40 | 90 days supply | Qty: 90 | Fill #0

## 2019-03-15 MED FILL — MINOCYCLINE 50 MG CAPSULE: 50 | 30 days supply | Qty: 60 | Fill #0

## 2019-04-09 MED FILL — ESTRADIOL 0.1 MG/24HR PTTW: 0.1 | 28 days supply | Qty: 8 | Fill #1

## 2019-04-09 MED FILL — buPROPion HCL ER (XL) 300 M: 300 | 90 days supply | Qty: 90 | Fill #0

## 2019-04-09 MED FILL — PANTOPRAZOLE SOD DR 40 MG T: 40 | 28 days supply | Qty: 12 | Fill #1

## 2019-04-09 MED FILL — MINOCYCLINE 50 MG CAPSULE: 50 | 30 days supply | Qty: 60 | Fill #1

## 2019-04-10 MED FILL — metroNIDAZOLE 0.75 % GEL: 0.75 | 30 days supply | Qty: 45 | Fill #0

## 2019-05-06 DIAGNOSIS — Z0000 Encounter for general adult medical examination without abnormal findings: Secondary | ICD-10-CM | POA: Diagnosis not present

## 2019-05-06 DIAGNOSIS — E785 Hyperlipidemia, unspecified: Secondary | ICD-10-CM | POA: Diagnosis not present

## 2019-05-06 DIAGNOSIS — R030 Elevated blood-pressure reading, without diagnosis of hypertension: Secondary | ICD-10-CM | POA: Diagnosis not present

## 2019-05-06 DIAGNOSIS — L659 Nonscarring hair loss, unspecified: Secondary | ICD-10-CM | POA: Diagnosis not present

## 2019-05-06 DIAGNOSIS — E559 Vitamin D deficiency, unspecified: Secondary | ICD-10-CM | POA: Diagnosis not present

## 2019-05-06 DIAGNOSIS — Z79899 Other long term (current) drug therapy: Secondary | ICD-10-CM | POA: Diagnosis not present

## 2019-05-06 DIAGNOSIS — R3129 Other microscopic hematuria: Secondary | ICD-10-CM | POA: Diagnosis not present

## 2019-05-06 DIAGNOSIS — K219 Gastro-esophageal reflux disease without esophagitis: Secondary | ICD-10-CM | POA: Diagnosis not present

## 2019-05-10 MED FILL — PRAVASTATIN SODIUM 80 MG TA: 80 | 90 days supply | Qty: 90 | Fill #0

## 2019-05-10 MED FILL — metroNIDAZOLE 0.75 % GEL: 0.75 | 30 days supply | Qty: 45 | Fill #1

## 2019-05-10 MED FILL — ESTRADIOL 0.1 MG/24HR PTTW: 0.1 | 28 days supply | Qty: 8 | Fill #2

## 2019-05-10 MED FILL — PANTOPRAZOLE SOD DR 40 MG T: 40 | 28 days supply | Qty: 12 | Fill #2

## 2019-05-10 MED FILL — MINOCYCLINE 50 MG CAPSULE: 50 | 30 days supply | Qty: 60 | Fill #2

## 2019-05-10 MED FILL — traZODone HCL 50 MG TABS: 50 | 30 days supply | Qty: 60 | Fill #1

## 2019-05-26 MED FILL — PRAVASTATIN NA 40 MG TAB: 40 | 90 days supply | Qty: 90 | Fill #1

## 2019-06-17 MED FILL — MINOCYCLINE 50 MG CAPSULE: 50 | 30 days supply | Qty: 60 | Fill #3

## 2019-06-18 MED FILL — ESTRADIOL 0.1 MG/24HR PTTW: 0.1 | 28 days supply | Qty: 8 | Fill #3

## 2019-07-07 DIAGNOSIS — E782 Mixed hyperlipidemia: Secondary | ICD-10-CM | POA: Diagnosis not present

## 2019-07-09 MED FILL — PANTOPRAZOLE SOD DR 40 MG T: 40 | 28 days supply | Qty: 12 | Fill #3

## 2019-07-09 MED FILL — buPROPion HCL ER (XL) 300 M: 300 | 90 days supply | Qty: 90 | Fill #1

## 2019-07-09 MED FILL — traZODone HCL 50 MG TABS: 50 | 30 days supply | Qty: 60 | Fill #2

## 2019-07-10 MED FILL — ESTRADIOL 0.1 MG/24HR PTTW: 0.1 | 28 days supply | Qty: 8 | Fill #4

## 2019-07-14 MED FILL — MINOCYCLINE 50 MG CAPSULE: 50 | 30 days supply | Qty: 60 | Fill #0

## 2019-07-15 DIAGNOSIS — R030 Elevated blood-pressure reading, without diagnosis of hypertension: Secondary | ICD-10-CM | POA: Diagnosis not present

## 2019-07-15 DIAGNOSIS — E785 Hyperlipidemia, unspecified: Secondary | ICD-10-CM | POA: Diagnosis not present

## 2019-07-15 MED FILL — VALSARTAN 40 MG TABLET: 40 | 30 days supply | Qty: 30 | Fill #0

## 2019-08-05 DIAGNOSIS — R030 Elevated blood-pressure reading, without diagnosis of hypertension: Secondary | ICD-10-CM | POA: Diagnosis not present

## 2019-08-05 DIAGNOSIS — E785 Hyperlipidemia, unspecified: Secondary | ICD-10-CM | POA: Diagnosis not present

## 2019-08-07 MED FILL — ESTRADIOL 0.1 MG/24HR PTTW: 0.1 | 28 days supply | Qty: 8 | Fill #5

## 2019-08-10 MED FILL — VALSARTAN-HCTZ 80-12.5 MG T: 80-12.5 | 30 days supply | Qty: 30 | Fill #0

## 2019-08-24 MED FILL — MINOCYCLINE 50 MG CAPSULE: 50 | 30 days supply | Qty: 60 | Fill #1

## 2019-08-25 MED FILL — metroNIDAZOLE 0.75 % GEL: 0.75 | 30 days supply | Qty: 45 | Fill #0

## 2019-08-26 DIAGNOSIS — H40013 Open angle with borderline findings, low risk, bilateral: Secondary | ICD-10-CM | POA: Diagnosis not present

## 2019-08-26 DIAGNOSIS — H5213 Myopia, bilateral: Secondary | ICD-10-CM | POA: Diagnosis not present

## 2019-09-04 MED FILL — ESTRADIOL 0.1 MG/24HR PTTW: 0.1 | 28 days supply | Qty: 8 | Fill #6

## 2019-09-06 MED FILL — VALSARTAN-HCTZ 80-12.5 MG T: 80-12.5 | 30 days supply | Qty: 30 | Fill #1

## 2019-09-06 MED FILL — PRAVASTATIN NA 40 MG TAB: 40 | 90 days supply | Qty: 90 | Fill #2

## 2019-09-08 ENCOUNTER — Encounter: Payer: Self-pay | Admitting: Cardiology

## 2019-09-09 ENCOUNTER — Ambulatory Visit: Payer: Self-pay | Admitting: Cardiology

## 2019-09-09 ENCOUNTER — Other Ambulatory Visit: Payer: Self-pay

## 2019-09-09 ENCOUNTER — Encounter: Payer: Self-pay | Admitting: Cardiology

## 2019-09-09 ENCOUNTER — Ambulatory Visit (INDEPENDENT_AMBULATORY_CARE_PROVIDER_SITE_OTHER): Payer: 59 | Admitting: Cardiology

## 2019-09-09 VITALS — BP 137/68 | HR 68 | Temp 98.0°F | Ht 68.0 in | Wt 213.0 lb

## 2019-09-09 DIAGNOSIS — I10 Essential (primary) hypertension: Secondary | ICD-10-CM

## 2019-09-09 DIAGNOSIS — E782 Mixed hyperlipidemia: Secondary | ICD-10-CM

## 2019-09-09 DIAGNOSIS — Z8249 Family history of ischemic heart disease and other diseases of the circulatory system: Secondary | ICD-10-CM | POA: Insufficient documentation

## 2019-09-09 MED ORDER — ROSUVASTATIN CALCIUM 20 MG PO TABS: 20.0000 mg | ORAL_TABLET | Freq: Every day | ORAL | 3 refills | Status: DC

## 2019-09-09 MED FILL — ROSUVASTATIN CALCIUM 20 MG: 20 | 30 days supply | Qty: 30 | Fill #0

## 2019-09-09 NOTE — Progress Notes (Signed)
=    Patient referred by Josetta Huddle, MD for hyperlipidemia  Subjective:   Kathleen Wise, female    DOB: 1956/08/16, 63 y.o.   MRN: [id number]   Chief Complaint  Patient presents with  . Hypertension  . Hyperlipidemia  . New Patient (Initial Visit)    HPI  63 y.o. Caucasian female with hypertension, hyperlipidemia.  Patient works at [employer], walks regularly without any complaints of chest pain, shortness of breath. Blood pressure is well controlled. LDL remains elevated. She is on pravastatin with virtually no change in her lipid panel. Previously, she tried lipitor but had myalgias.   Past Medical History:  Diagnosis Date  . Anxiety   . DUB (dysfunctional uterine bleeding)   . GERD (gastroesophageal reflux disease)   . Pelvic pain in female   . Uterus, adenomyosis   . Wears contact lenses      Past Surgical History:  Procedure Laterality Date  . COLONOSCOPY    . HYSTEROSCOPY W/D&C  09/24/2012   Procedure: DILATATION AND CURETTAGE /HYSTEROSCOPY;  Surgeon: Maisie Fus, MD;  Location: [facility];  Service: Gynecology;  Laterality: N/A;  . LAPAROSCOPIC ASSISTED VAGINAL HYSTERECTOMY N/A 11/01/2014   Procedure: LAPAROSCOPIC ASSISTED VAGINAL HYSTERECTOMY;  Surgeon: Maisie Fus, MD;  Location[facility];  Service: Gynecology;  Laterality: N/A;  . SALPINGOOPHORECTOMY Bilateral 11/01/2014   Procedure: SALPINGO OOPHORECTOMY;  Surgeon: Maisie Fus, MD;  Location: [facility];  Service: Gynecology;  Laterality: Bilateral;     Social History   Socioeconomic History  . Marital status: Married    Spouse name: Not on file  . Number of children: 2  . Years of education: Not on file  . Highest education level: Not on file  Occupational History  . Not on file  Social Needs  . Financial resource strain: Not on file  . Food insecurity    Worry: Not on file    Inability: Not on file  . Transportation needs    Medical: Not on file    Non-medical:  Not on file  Tobacco Use  . Smoking status: Never Smoker  . Smokeless tobacco: Never Used  Substance and Sexual Activity  . Alcohol use: Yes    Alcohol/week: 14.0 standard drinks    Types: 14 Glasses of wine per week    Comment: 2 wine daily  . Drug use: No  . Sexual activity: Not on file  Lifestyle  . Physical activity    Days per week: Not on file    Minutes per session: Not on file  . Stress: Not on file  Relationships  . Social Herbalist on phone: Not on file    Gets together: Not on file    Attends religious service: Not on file    Active member of club or organization: Not on file    Attends meetings of clubs or organizations: Not on file    Relationship status: Not on file  . Intimate partner violence    Fear of current or ex partner: Not on file    Emotionally abused: Not on file    Physically abused: Not on file    Forced sexual activity: Not on file  Other Topics Concern  . Not on file  Social History Narrative  . Not on file     Family History  Problem Relation Age of Onset  . Hypertension Mother   . Hyperlipidemia Mother   . Arrhythmia Mother   .  Thyroid disease Mother   . Seizures Mother   . Diabetes Father   . Hypertension Father   . Heart disease Father   . Diabetes Sister   . Hyperlipidemia Sister      Current Outpatient Medications on File Prior to Visit  Medication Sig Dispense Refill  . Biotin 10 MG CAPS Take 1 capsule by mouth daily.    . cetirizine (ZYRTEC) 10 MG tablet Take 10 mg by mouth daily as needed.    . cholecalciferol (VITAMIN D3) 25 MCG (1000 UT) tablet Take 1,000 Units by mouth daily.    Marland Kitchen PANTOPRAZOLE SODIUM PO Take 20 mg by mouth 3 (three) times a week.    Marland Kitchen PRAVASTATIN SODIUM PO Take 40 mg by mouth daily.    . TRAZODONE HCL PO Take 50 mg by mouth daily.    . valsartan-hydrochlorothiazide (DIOVAN-HCT) 80-12.5 MG tablet Take 1 tablet by mouth daily.    Marland Kitchen buPROPion (WELLBUTRIN XL) 300 MG 24 hr tablet Take 300 mg by  mouth every morning.     . clindamycin (CLINDAGEL) 1 % gel Apply 1 application topically 2 (two) times daily. To the face  9  . estradiol (VIVELLE-DOT) 0.1 MG/24HR patch Place 1 patch onto the skin 2 (two) times a week. Switch out on Every Sunday and Thursday,    . minocycline (MINOCIN,DYNACIN) 100 MG capsule Take 1 capsule by mouth at bedtime.  4   No current facility-administered medications on file prior to visit.     Cardiovascular studies:  EKG 09/09/2019: Sinus rhythm 68 bpm. Normal EKG.    Recent labs: 07/07/2019: Glucose 85. BUN/Cr 14/0.77. eGFR 76. Na/K 139/4.5. Rest of the CMP normal.  H/H 13.9/40.4. MCV 94. Platelets 202.  Chol 268, TG 235, HDL 57, LDL 164.  02/11/2018: Glucose 92. BUN/Cr 12/0.75. eGFR 79. Na/K 139/4.7. Rest of the CMP normal.     Review of Systems  Constitution: Negative for decreased appetite, malaise/fatigue, weight gain and weight loss.  HENT: Negative for congestion.   Eyes: Negative for visual disturbance.  Cardiovascular: Negative for chest pain, dyspnea on exertion, leg swelling, palpitations and syncope.  Respiratory: Negative for cough.   Endocrine: Negative for cold intolerance.  Hematologic/Lymphatic: Does not bruise/bleed easily.  Skin: Negative for itching and rash.  Musculoskeletal: Negative for myalgias.  Gastrointestinal: Negative for abdominal pain, nausea and vomiting.  Genitourinary: Negative for dysuria.  Neurological: Negative for dizziness and weakness.  Psychiatric/Behavioral: The patient is not nervous/anxious.   All other systems reviewed and are negative.        Vitals:   09/09/19 1051  BP: 137/68  Pulse: 68  Temp: 98 F (36.7 C)  SpO2: 99%     Body mass index is 32.39 kg/m. Filed Weights   09/09/19 1051  Weight: 213 lb (96.6 kg)     Objective:   Physical Exam  Constitutional: She is oriented to person, place, and time. She appears well-developed and well-nourished. No distress.  HENT:  Head:  Normocephalic and atraumatic.  Eyes: Pupils are equal, round, and reactive to light. Conjunctivae are normal.  Neck: No JVD present.  Cardiovascular: Normal rate, regular rhythm and intact distal pulses.  Pulmonary/Chest: Effort normal and breath sounds normal. She has no wheezes. She has no rales.  Abdominal: Soft. Bowel sounds are normal. There is no rebound.  Musculoskeletal:        General: No edema.  Lymphadenopathy:    She has no cervical adenopathy.  Neurological: She is alert and oriented to person,  place, and time. No cranial nerve deficit.  Skin: Skin is warm and dry.  Psychiatric: She has a normal mood and affect.  Nursing note and vitals reviewed.         Assessment & Recommendations:   63 y.o. Caucasian female with hypertension, hyperlipidemia.  Hyperlipidemia: Based on [place] criteria, familial hypercholesterolemia is less likely. She is currently on pravastatin. Recommend switching to crestor 20 mg daily. If she tolerates well, will continue and repeat lipid panel in 3 months. If unable to tolerate, would consider PCSK9 inhibitor-given her statin intolerance.    Thank you for referring the patient to [place]. Please feel free to contact with any questions.  Nigel Mormon, MD [facility] Pager: [phone] Office: [phone] If no answer Cell [phone]

## 2019-09-22 MED FILL — PANTOPRAZOLE SOD DR 40 MG T: 40 | 28 days supply | Qty: 12 | Fill #4

## 2019-09-22 MED FILL — MINOCYCLINE 50 MG CAPSULE: 50 | 30 days supply | Qty: 60 | Fill #2

## 2019-10-02 MED FILL — ESTRADIOL 0.1 MG/24HR PTTW: 0.1 | 28 days supply | Qty: 8 | Fill #0

## 2019-10-04 MED FILL — VALSARTAN-HCTZ 80-12.5 MG T: 80-12.5 | 30 days supply | Qty: 30 | Fill #2

## 2019-10-04 MED FILL — ROSUVASTATIN CALCIUM 20 MG: 20 | 90 days supply | Qty: 90 | Fill #1

## 2019-10-04 MED FILL — buPROPion HCL ER (XL) 300 M: 300 | 90 days supply | Qty: 90 | Fill #2

## 2019-10-06 DIAGNOSIS — M25512 Pain in left shoulder: Secondary | ICD-10-CM | POA: Diagnosis not present

## 2019-10-12 MED FILL — traZODone HCL 50 MG TABS: 50 | 30 days supply | Qty: 60 | Fill #3

## 2019-10-27 MED FILL — MINOCYCLINE 50 MG CAPSULE: 50 | 30 days supply | Qty: 60 | Fill #3

## 2019-10-27 MED FILL — PANTOPRAZOLE SOD DR 40 MG T: 40 | 28 days supply | Qty: 12 | Fill #5

## 2019-10-29 MED FILL — VALSARTAN-HCTZ 80-12.5 MG T: 80-12.5 | 30 days supply | Qty: 30 | Fill #3

## 2019-10-29 MED FILL — metroNIDAZOLE 0.75 % GEL: 0.75 | 30 days supply | Qty: 45 | Fill #1

## 2019-11-17 DIAGNOSIS — Z01419 Encounter for gynecological examination (general) (routine) without abnormal findings: Secondary | ICD-10-CM | POA: Diagnosis not present

## 2019-11-17 DIAGNOSIS — Z6832 Body mass index (BMI) 32.0-32.9, adult: Secondary | ICD-10-CM | POA: Diagnosis not present

## 2019-11-17 DIAGNOSIS — Z1231 Encounter for screening mammogram for malignant neoplasm of breast: Secondary | ICD-10-CM | POA: Diagnosis not present

## 2019-11-17 MED FILL — ESTRADIOL 0.1 MG/24HR PTTW: 0.1 | 28 days supply | Qty: 8 | Fill #0

## 2019-11-18 ENCOUNTER — Other Ambulatory Visit: Payer: Self-pay | Admitting: Obstetrics and Gynecology

## 2019-11-18 DIAGNOSIS — N951 Menopausal and female climacteric states: Secondary | ICD-10-CM

## 2019-11-27 MED FILL — MINOCYCLINE 50 MG CAPSULE: 50 | 30 days supply | Qty: 60 | Fill #4

## 2019-11-27 MED FILL — VALSARTAN-HCTZ 80-12.5 MG T: 80-12.5 | 30 days supply | Qty: 30 | Fill #4

## 2019-12-10 DIAGNOSIS — E782 Mixed hyperlipidemia: Secondary | ICD-10-CM | POA: Diagnosis not present

## 2019-12-11 LAB — LIPID PANEL
Chol/HDL Ratio: 3.1 ratio (ref 0.0–4.4)
Cholesterol, Total: 191 mg/dL (ref 100–199)
HDL: 61 mg/dL (ref >39)
LDL Chol Calc (NIH): 85 mg/dL (ref 0–99)
Triglycerides: 273 mg/dL — ABNORMAL HIGH (ref 0–149)
VLDL Cholesterol Cal: 45 mg/dL — ABNORMAL HIGH (ref 5–40)

## 2019-12-16 ENCOUNTER — Other Ambulatory Visit: Payer: Self-pay

## 2019-12-16 ENCOUNTER — Encounter: Payer: Self-pay | Admitting: Cardiology

## 2019-12-16 ENCOUNTER — Ambulatory Visit (INDEPENDENT_AMBULATORY_CARE_PROVIDER_SITE_OTHER): Payer: 59 | Admitting: Cardiology

## 2019-12-16 VITALS — BP 131/57 | HR 64 | Ht 68.0 in | Wt 219.0 lb

## 2019-12-16 DIAGNOSIS — E782 Mixed hyperlipidemia: Secondary | ICD-10-CM

## 2019-12-16 NOTE — Progress Notes (Signed)
=    Patient referred by Josetta Huddle, MD for hyperlipidemia  Subjective:   Kathleen Wise, female    DOB: Jun 07, 1956, 64 y.o.   MRN: [id number]   Chief Complaint  Patient presents with  . Hyperlipidemia    HPI  64 y.o. Caucasian female with hypertension, hyperlipidemia.  After switching pravastatin to rosuvastatin 20 mg daily, her LDL is down from 161 to 85. However TG remains elevated.   Current Outpatient Medications on File Prior to Visit  Medication Sig Dispense Refill  . Biotin 10 MG CAPS Take 1 capsule by mouth daily.    Marland Kitchen buPROPion (WELLBUTRIN XL) 300 MG 24 hr tablet Take 300 mg by mouth every morning.     . cetirizine (ZYRTEC) 10 MG tablet Take 10 mg by mouth daily as needed.    . cholecalciferol (VITAMIN D3) 25 MCG (1000 UT) tablet Take 1,000 Units by mouth daily.    . clindamycin (CLINDAGEL) 1 % gel Apply 1 application topically 2 (two) times daily. To the face  9  . estradiol (VIVELLE-DOT) 0.1 MG/24HR patch Place 1 patch onto the skin 2 (two) times a week. Switch out on Every Sunday and Thursday,    . minocycline (MINOCIN,DYNACIN) 100 MG capsule Take 1 capsule by mouth at bedtime.  4  . PANTOPRAZOLE SODIUM PO Take 20 mg by mouth 3 (three) times a week.    . rosuvastatin (CRESTOR) 20 MG tablet Take 1 tablet (20 mg total) by mouth daily. 30 tablet 3  . TRAZODONE HCL PO Take 50 mg by mouth daily.    . valsartan-hydrochlorothiazide (DIOVAN-HCT) 80-12.5 MG tablet Take 1 tablet by mouth daily.     No current facility-administered medications on file prior to visit.    Cardiovascular studies:  EKG 09/09/2019: Sinus rhythm 68 bpm. Normal EKG.    Recent labs: 12/10/2019: Chol 191, TG 273, HDL 61, LDL 85.   07/07/2019: Glucose 85. BUN/Cr 14/0.77. eGFR 76. Na/K 139/4.5. Rest of the CMP normal.  H/H 13.9/40.4. MCV 94. Platelets 202.  Chol 268, TG 235, HDL 57, LDL 164.  02/11/2018: Glucose 92. BUN/Cr 12/0.75. eGFR 79. Na/K 139/4.7. Rest of the CMP normal.     Review  of Systems  Cardiovascular: Negative for chest pain, dyspnea on exertion, leg swelling, palpitations and syncope.         Vitals:   12/16/19 1119  BP: (!) 131/57  Pulse: 64  SpO2: 98%     Body mass index is 33.3 kg/m. Filed Weights   12/16/19 1119  Weight: 219 lb (99.3 kg)     Objective:   Physical Exam  Constitutional: She appears well-developed and well-nourished.  Neck: No JVD present.  Cardiovascular: Normal rate, regular rhythm, normal heart sounds and intact distal pulses.  No murmur heard. Pulmonary/Chest: Effort normal and breath sounds normal. She has no wheezes. She has no rales.  Musculoskeletal:        General: No edema.  Nursing note and vitals reviewed.         Assessment & Recommendations:   64 y.o. Caucasian female with hypertension, hyperlipidemia.  Hyperlipidemia: LDL down from 161 to 85 on rosuvastatin 20 mg daily. TG remains elevated at 271. She does not have known ASCVD or DM and may not qualify for Vascepa. One option is to increase rosuvastatin to 40 mg daily and see if there is any improvement in TG. She has had some myalgias and would like to avoid increasing rosuvastatin to 40 mg daily. Okay to use  over the counter fish oil capsules. Repeat lipid panel in 6 months then f/u.  Nigel Mormon, MD [facility] Pager: [phone] Office: [phone] If no answer Cell [phone]

## 2019-12-19 MED FILL — PANTOPRAZOLE SOD DR 40 MG T: 40 | 28 days supply | Qty: 12 | Fill #6

## 2019-12-27 DIAGNOSIS — D225 Melanocytic nevi of trunk: Secondary | ICD-10-CM | POA: Diagnosis not present

## 2019-12-27 DIAGNOSIS — L821 Other seborrheic keratosis: Secondary | ICD-10-CM | POA: Diagnosis not present

## 2019-12-27 DIAGNOSIS — D224 Melanocytic nevi of scalp and neck: Secondary | ICD-10-CM | POA: Diagnosis not present

## 2019-12-27 DIAGNOSIS — L718 Other rosacea: Secondary | ICD-10-CM | POA: Diagnosis not present

## 2019-12-27 MED FILL — MINOCYCLINE HCL 100 MG CAPS: 100 | 30 days supply | Qty: 60 | Fill #0

## 2019-12-27 MED FILL — AZELAIC ACID 15 % GEL: 15 | 25 days supply | Qty: 50 | Fill #0

## 2019-12-28 MED FILL — VALSARTAN-HCTZ 80-12.5 MG T: 80-12.5 | 30 days supply | Qty: 30 | Fill #5

## 2019-12-28 MED FILL — traZODone HCL 50 MG TABS: 50 | 30 days supply | Qty: 60 | Fill #0

## 2019-12-28 MED FILL — ESTRADIOL 0.1 MG/24HR PTTW: 0.1 | 28 days supply | Qty: 8 | Fill #1

## 2020-01-10 ENCOUNTER — Other Ambulatory Visit: Payer: Self-pay | Admitting: Cardiology

## 2020-01-10 MED FILL — ROSUVASTATIN CALCIUM 20 MG: 20 | 90 days supply | Qty: 90 | Fill #0

## 2020-01-10 MED FILL — BUPROPION HCL XL 300 MG TAB: 300 | 30 days supply | Qty: 30 | Fill #3

## 2020-02-04 MED FILL — ESTRADIOL 0.1 MG/24HR PTTW: 0.1 | 28 days supply | Qty: 8 | Fill #2

## 2020-02-04 MED FILL — AZELAIC ACID 15 % GEL: 15 | 25 days supply | Qty: 50 | Fill #1

## 2020-02-04 MED FILL — PANTOPRAZOLE SOD DR 40 MG T: 40 | 28 days supply | Qty: 12 | Fill #7

## 2020-02-07 ENCOUNTER — Other Ambulatory Visit: Payer: Self-pay

## 2020-02-07 ENCOUNTER — Ambulatory Visit
Admission: RE | Admit: 2020-02-07 | Discharge: 2020-02-07 | Disposition: A | Discharge status: Home / Self Care | Payer: 59 | Source: Ambulatory Visit | Attending: Obstetrics and Gynecology | Admitting: Obstetrics and Gynecology

## 2020-02-07 DIAGNOSIS — Z13820 Encounter for screening for osteoporosis: Secondary | ICD-10-CM | POA: Diagnosis not present

## 2020-02-07 DIAGNOSIS — Z780 Asymptomatic menopausal state: Secondary | ICD-10-CM | POA: Diagnosis not present

## 2020-02-07 DIAGNOSIS — N951 Menopausal and female climacteric states: Secondary | ICD-10-CM

## 2020-02-23 MED FILL — VALSARTAN-HCTZ 80-12.5 MG T: 80-12.5 | 30 days supply | Qty: 30 | Fill #6

## 2020-02-23 MED FILL — buPROPion HCL ER (XL) 300 M: 300 | 30 days supply | Qty: 30 | Fill #4

## 2020-02-23 MED FILL — MINOCYCLINE HCL 100 MG CAPS: 100 | 30 days supply | Qty: 60 | Fill #1

## 2020-02-23 MED FILL — traZODone HCL 50 MG TABS: 50 | 30 days supply | Qty: 60 | Fill #1

## 2020-03-07 MED FILL — ESTRADIOL 0.1 MG/24HR PTTW: 0.1 | 28 days supply | Qty: 8 | Fill #3

## 2020-03-17 ENCOUNTER — Other Ambulatory Visit (HOSPITAL_COMMUNITY): Payer: Self-pay | Admitting: Internal Medicine

## 2020-03-17 MED FILL — PANTOPRAZOLE SOD DR 40 MG T: 40 | 28 days supply | Qty: 12 | Fill #0

## 2020-03-21 ENCOUNTER — Other Ambulatory Visit (HOSPITAL_COMMUNITY): Payer: Self-pay | Admitting: Internal Medicine

## 2020-03-21 MED FILL — VALSARTAN-HCTZ 80-12.5 MG T: 80-12.5 | 30 days supply | Qty: 30 | Fill #7

## 2020-03-22 MED FILL — buPROPion HCL ER (XL) 300 M: 300 | 90 days supply | Qty: 90 | Fill #0

## 2020-04-18 MED FILL — PANTOPRAZOLE SOD DR 40 MG T: 40 | 28 days supply | Qty: 12 | Fill #1

## 2020-04-18 MED FILL — AZELAIC ACID 15 % GEL: 15 | 25 days supply | Qty: 50 | Fill #2

## 2020-04-18 MED FILL — VALSARTAN-HCTZ 80-12.5 MG T: 80-12.5 | 30 days supply | Qty: 30 | Fill #8

## 2020-04-18 MED FILL — ROSUVASTATIN CALCIUM 20 MG: 20 | 90 days supply | Qty: 90 | Fill #1

## 2020-05-11 ENCOUNTER — Other Ambulatory Visit (HOSPITAL_COMMUNITY): Payer: Self-pay | Admitting: Internal Medicine

## 2020-05-11 DIAGNOSIS — Z1211 Encounter for screening for malignant neoplasm of colon: Secondary | ICD-10-CM | POA: Diagnosis not present

## 2020-05-11 DIAGNOSIS — J309 Allergic rhinitis, unspecified: Secondary | ICD-10-CM | POA: Diagnosis not present

## 2020-05-11 DIAGNOSIS — L659 Nonscarring hair loss, unspecified: Secondary | ICD-10-CM | POA: Diagnosis not present

## 2020-05-11 DIAGNOSIS — E785 Hyperlipidemia, unspecified: Secondary | ICD-10-CM | POA: Diagnosis not present

## 2020-05-11 DIAGNOSIS — Z1231 Encounter for screening mammogram for malignant neoplasm of breast: Secondary | ICD-10-CM | POA: Diagnosis not present

## 2020-05-11 DIAGNOSIS — Z0000 Encounter for general adult medical examination without abnormal findings: Secondary | ICD-10-CM | POA: Diagnosis not present

## 2020-05-11 DIAGNOSIS — R030 Elevated blood-pressure reading, without diagnosis of hypertension: Secondary | ICD-10-CM | POA: Diagnosis not present

## 2020-05-11 DIAGNOSIS — Z23 Encounter for immunization: Secondary | ICD-10-CM | POA: Diagnosis not present

## 2020-05-11 DIAGNOSIS — E559 Vitamin D deficiency, unspecified: Secondary | ICD-10-CM | POA: Diagnosis not present

## 2020-05-11 DIAGNOSIS — R3129 Other microscopic hematuria: Secondary | ICD-10-CM | POA: Diagnosis not present

## 2020-05-24 DIAGNOSIS — E782 Mixed hyperlipidemia: Secondary | ICD-10-CM | POA: Diagnosis not present

## 2020-05-25 LAB — LIPID PANEL
Chol/HDL Ratio: 3 ratio (ref 0.0–4.4)
Cholesterol, Total: 187 mg/dL (ref 100–199)
HDL: 63 mg/dL (ref >39)
LDL Chol Calc (NIH): 97 mg/dL (ref 0–99)
Triglycerides: 155 mg/dL — ABNORMAL HIGH (ref 0–149)
VLDL Cholesterol Cal: 27 mg/dL (ref 5–40)

## 2020-05-29 MED FILL — VALSARTAN-HCTZ 80-12.5 MG T: 80-12.5 | 30 days supply | Qty: 30 | Fill #9

## 2020-06-11 MED FILL — ESTRADIOL 0.1 MG/24HR PTTW: 0.1 | 28 days supply | Qty: 8 | Fill #6

## 2020-06-15 MED FILL — PANTOPRAZOLE SOD DR 40 MG T: 40 | 28 days supply | Qty: 12 | Fill #2

## 2020-06-16 ENCOUNTER — Ambulatory Visit: Payer: 59 | Admitting: Cardiology

## 2020-06-23 MED FILL — VALSARTAN-HCTZ 80-12.5 MG T: 80-12.5 | 30 days supply | Qty: 30 | Fill #10

## 2020-07-10 ENCOUNTER — Other Ambulatory Visit: Payer: Self-pay

## 2020-07-10 ENCOUNTER — Ambulatory Visit: Payer: 59 | Admitting: Cardiology

## 2020-07-10 ENCOUNTER — Encounter: Payer: Self-pay | Admitting: Cardiology

## 2020-07-10 VITALS — BP 147/75 | HR 63 | Resp 15 | Ht 68.0 in | Wt 216.0 lb

## 2020-07-10 DIAGNOSIS — I10 Essential (primary) hypertension: Secondary | ICD-10-CM | POA: Diagnosis not present

## 2020-07-10 DIAGNOSIS — E782 Mixed hyperlipidemia: Secondary | ICD-10-CM

## 2020-07-10 MED FILL — ESTRADIOL 0.1 MG/24HR PTTW: 0.1 | 28 days supply | Qty: 8 | Fill #7

## 2020-07-10 MED FILL — AZELAIC ACID 15 % GEL: 15 | 25 days supply | Qty: 50 | Fill #3

## 2020-07-10 MED FILL — MINOCYCLINE HCL 100 MG CAPS: 100 | 30 days supply | Qty: 60 | Fill #3

## 2020-07-10 MED FILL — traZODone HCL 50 MG TABS: 50 | 30 days supply | Qty: 60 | Fill #3

## 2020-07-10 MED FILL — buPROPion HCL ER (XL) 300 M: 300 | 90 days supply | Qty: 90 | Fill #1

## 2020-07-10 NOTE — Progress Notes (Signed)
=    Patient referred by Josetta Huddle, MD for hyperlipidemia  Subjective:   Kathleen Wise, female    DOB: [birth year], 64 y.o.   MRN: [id number]   Chief Complaint  Patient presents with  . Follow-up    6 month  . Results    HPI  64 y.o. Caucasian female with hypertension, hyperlipidemia.  Reviewed recent lipid panel with the patient, details below.   Blood pressure elevated today, but usually much lower at home.   Current Outpatient Medications on File Prior to Visit  Medication Sig Dispense Refill  . Biotin 10 MG CAPS Take 1 capsule by mouth daily.    Marland Kitchen buPROPion (WELLBUTRIN XL) 300 MG 24 hr tablet Take 300 mg by mouth every morning.     . cetirizine (ZYRTEC) 10 MG tablet Take 10 mg by mouth daily as needed.    . cholecalciferol (VITAMIN D3) 25 MCG (1000 UT) tablet Take 1,000 Units by mouth daily.    . clindamycin (CLINDAGEL) 1 % gel Apply 1 application topically 2 (two) times daily. To the face  9  . estradiol (VIVELLE-DOT) 0.1 MG/24HR patch Place 1 patch onto the skin 2 (two) times a week. Switch out on Every Sunday and Thursday,    . minocycline (MINOCIN,DYNACIN) 100 MG capsule Take 1 capsule by mouth at bedtime.  4  . PANTOPRAZOLE SODIUM PO Take 20 mg by mouth 3 (three) times a week.    . rosuvastatin (CRESTOR) 20 MG tablet TAKE 1 TABLET BY MOUTH ONCE DAILY 90 tablet 3  . TRAZODONE HCL PO Take 50 mg by mouth daily.    . valsartan-hydrochlorothiazide (DIOVAN-HCT) 80-12.5 MG tablet Take 1 tablet by mouth daily.     No current facility-administered medications on file prior to visit.    Cardiovascular studies:   EKG 07/10/2020: Sinus rhythm 61 bpm Normal EKG  Recent labs: 05/24/2020: Chol 187, TG 155, HDL 63, LDL 97.  12/10/2019: Chol 191, TG 273, HDL 61, LDL 85.   07/07/2019: Glucose 85. BUN/Cr 14/0.77. eGFR 76. Na/K 139/4.5. Rest of the CMP normal.  H/H 13.9/40.4. MCV 94. Platelets 202.  Chol 268, TG 235, HDL 57, LDL 164.  02/11/2018: Glucose 92. BUN/Cr  12/0.75. eGFR 79. Na/K 139/4.7. Rest of the CMP normal.     Review of Systems  Cardiovascular: Negative for chest pain, dyspnea on exertion, leg swelling, palpitations and syncope.       Vitals:   07/10/20 1545  BP: (!) 147/75  Pulse: 63  Resp: 15  SpO2: 100%     Body mass index is 32.84 kg/m. Filed Weights   07/10/20 1545  Weight: 216 lb (98 kg)     Objective:   Physical Exam Vitals and nursing note reviewed.  Constitutional:      Appearance: She is well-developed.  Neck:     Vascular: No JVD.  Cardiovascular:     Rate and Rhythm: Normal rate and regular rhythm.     Pulses: Intact distal pulses.     Heart sounds: Normal heart sounds. No murmur heard.   Pulmonary:     Effort: Pulmonary effort is normal.     Breath sounds: Normal breath sounds. No wheezing or rales.           Assessment & Recommendations:   64 y.o. Caucasian female with hypertension, hyperlipidemia.  Hyperlipidemia: LDL up from 85 to 97, TG down from 273 to 155. Continue Crestor 20 mg. She has not tolerated higher doses in the past.  Hypertension: Suspect white coat hypertension. No changes made to antihypertensive therapy.   Lipid panel and f/u in 1 year.   Nigel Mormon, MD [facility] Pager: [phone] Office: [phone] If no answer Cell [phone]

## 2020-07-26 MED FILL — VALSARTAN-HCTZ 80-12.5 MG T: 80-12.5 | 30 days supply | Qty: 30 | Fill #11

## 2020-07-26 MED FILL — ROSUVASTATIN CALCIUM 20 MG: 20 | 90 days supply | Qty: 90 | Fill #2

## 2020-07-28 DIAGNOSIS — Z03818 Encounter for observation for suspected exposure to other biological agents ruled out: Secondary | ICD-10-CM | POA: Diagnosis not present

## 2020-07-31 MED FILL — NULYTELY LEMON-LI SOL: 1 days supply | Qty: 4000 | Fill #0

## 2020-08-02 MED FILL — ESTRADIOL 0.1 MG/24HR PTTW: 0.1 | 28 days supply | Qty: 8 | Fill #8

## 2020-08-02 MED FILL — PANTOPRAZOLE SOD DR 40 MG T: 40 | 28 days supply | Qty: 12 | Fill #3

## 2020-08-25 DIAGNOSIS — H40013 Open angle with borderline findings, low risk, bilateral: Secondary | ICD-10-CM | POA: Diagnosis not present

## 2020-08-25 DIAGNOSIS — H524 Presbyopia: Secondary | ICD-10-CM | POA: Diagnosis not present

## 2020-08-30 ENCOUNTER — Other Ambulatory Visit (HOSPITAL_COMMUNITY): Payer: Self-pay | Admitting: Dermatology

## 2020-08-30 MED FILL — ESTRADIOL 0.1 MG/24HR PTTW: 0.1 | 28 days supply | Qty: 8 | Fill #9

## 2020-08-30 MED FILL — VALSARTAN-HCTZ 80-12.5 MG T: 80-12.5 | 90 days supply | Qty: 90 | Fill #0

## 2020-08-30 MED FILL — AZELAIC ACID 15 % GEL: 15 | 25 days supply | Qty: 50 | Fill #0

## 2020-09-12 MED FILL — MINOCYCLINE HCL 100 MG CAPS: 100 | 30 days supply | Qty: 60 | Fill #4

## 2020-09-12 MED FILL — PANTOPRAZOLE SOD DR 40 MG T: 40 | 28 days supply | Qty: 12 | Fill #4

## 2020-09-12 MED FILL — traZODone HCL 50 MG TABS: 50 | 30 days supply | Qty: 60 | Fill #4

## 2020-09-28 MED FILL — ESTRADIOL 0.1 MG/24HR PTTW: 0.1 | 28 days supply | Qty: 8 | Fill #10

## 2020-10-17 DIAGNOSIS — Z1159 Encounter for screening for other viral diseases: Secondary | ICD-10-CM | POA: Diagnosis not present

## 2020-10-20 DIAGNOSIS — K635 Polyp of colon: Secondary | ICD-10-CM | POA: Diagnosis not present

## 2020-10-20 DIAGNOSIS — K644 Residual hemorrhoidal skin tags: Secondary | ICD-10-CM | POA: Diagnosis not present

## 2020-10-20 DIAGNOSIS — D123 Benign neoplasm of transverse colon: Secondary | ICD-10-CM | POA: Diagnosis not present

## 2020-10-20 DIAGNOSIS — K648 Other hemorrhoids: Secondary | ICD-10-CM | POA: Diagnosis not present

## 2020-10-20 DIAGNOSIS — K6389 Other specified diseases of intestine: Secondary | ICD-10-CM | POA: Diagnosis not present

## 2020-10-20 DIAGNOSIS — Z1211 Encounter for screening for malignant neoplasm of colon: Secondary | ICD-10-CM | POA: Diagnosis not present

## 2020-10-20 DIAGNOSIS — K922 Gastrointestinal hemorrhage, unspecified: Secondary | ICD-10-CM | POA: Diagnosis not present

## 2020-10-23 MED FILL — buPROPion HCL ER (XL) 300 M: 300 | 90 days supply | Qty: 90 | Fill #2

## 2020-10-23 MED FILL — ESTRADIOL 0.1 MG/24HR PTTW: 0.1 | 28 days supply | Qty: 8 | Fill #11

## 2020-10-23 MED FILL — ROSUVASTATIN CALCIUM 20 MG: 20 | 90 days supply | Qty: 90 | Fill #3

## 2020-11-07 MED FILL — MINOCYCLINE HCL 100 MG CAPS: 100 | 30 days supply | Qty: 60 | Fill #5

## 2020-11-07 MED FILL — traZODone HCL 50 MG TABS: 50 | 30 days supply | Qty: 60 | Fill #5

## 2020-11-10 DIAGNOSIS — Z23 Encounter for immunization: Secondary | ICD-10-CM | POA: Diagnosis not present

## 2020-11-14 ENCOUNTER — Other Ambulatory Visit (HOSPITAL_COMMUNITY): Payer: Self-pay | Admitting: Obstetrics and Gynecology

## 2020-11-14 MED FILL — ESTRADIOL 0.1 MG/24HR PTTW: 0.1 | 28 days supply | Qty: 8 | Fill #0

## 2020-11-14 MED FILL — AZELAIC ACID 15 % GEL: 15 | 25 days supply | Qty: 50 | Fill #1

## 2020-12-12 MED FILL — PANTOPRAZOLE SOD DR 40 MG T: 40 | 28 days supply | Qty: 12 | Fill #5

## 2020-12-12 MED FILL — VALSARTAN-HCTZ 80-12.5 MG T: 80-12.5 | 90 days supply | Qty: 90 | Fill #1

## 2020-12-22 ENCOUNTER — Other Ambulatory Visit (HOSPITAL_COMMUNITY): Payer: Self-pay | Admitting: Obstetrics and Gynecology

## 2020-12-22 MED FILL — ESTRADIOL 0.1 MG/24HR PTTW: 0.1 | 28 days supply | Qty: 8 | Fill #0

## 2020-12-26 ENCOUNTER — Other Ambulatory Visit (HOSPITAL_COMMUNITY): Payer: Self-pay | Admitting: Dermatology

## 2020-12-26 DIAGNOSIS — L723 Sebaceous cyst: Secondary | ICD-10-CM | POA: Diagnosis not present

## 2020-12-26 DIAGNOSIS — L281 Prurigo nodularis: Secondary | ICD-10-CM | POA: Diagnosis not present

## 2020-12-26 DIAGNOSIS — L821 Other seborrheic keratosis: Secondary | ICD-10-CM | POA: Diagnosis not present

## 2020-12-26 DIAGNOSIS — D225 Melanocytic nevi of trunk: Secondary | ICD-10-CM | POA: Diagnosis not present

## 2020-12-26 DIAGNOSIS — L718 Other rosacea: Secondary | ICD-10-CM | POA: Diagnosis not present

## 2020-12-26 MED FILL — AZELAIC ACID 15 % GEL: 15 | 30 days supply | Qty: 50 | Fill #0

## 2020-12-26 MED FILL — MINOCYCLINE HCL 100 MG TABS: 100 | 30 days supply | Qty: 30 | Fill #0

## 2020-12-26 MED FILL — BETAMETHASONE DP 0.05% LOT: 0.05 | 30 days supply | Qty: 60 | Fill #0

## 2020-12-28 DIAGNOSIS — M545 Low back pain, unspecified: Secondary | ICD-10-CM | POA: Diagnosis not present

## 2021-01-16 ENCOUNTER — Other Ambulatory Visit: Payer: Self-pay | Admitting: Cardiology

## 2021-01-16 MED FILL — buPROPion HCL ER (XL) 300 M: 300 | 90 days supply | Qty: 90 | Fill #3

## 2021-01-16 MED FILL — ROSUVASTATIN CALCIUM 20 MG: 20 | 90 days supply | Qty: 90 | Fill #0

## 2021-01-16 MED FILL — traZODone HCL 50 MG TABS: 50 | 90 days supply | Qty: 180 | Fill #0

## 2021-01-17 ENCOUNTER — Other Ambulatory Visit (HOSPITAL_COMMUNITY): Payer: Self-pay | Admitting: Obstetrics and Gynecology

## 2021-01-17 MED FILL — ESTRADIOL 0.1 MG/24HR PTTW: 0.1 | 28 days supply | Qty: 8 | Fill #0

## 2021-01-31 ENCOUNTER — Other Ambulatory Visit (HOSPITAL_COMMUNITY): Payer: Self-pay | Admitting: Obstetrics and Gynecology

## 2021-01-31 DIAGNOSIS — Z6833 Body mass index (BMI) 33.0-33.9, adult: Secondary | ICD-10-CM | POA: Diagnosis not present

## 2021-01-31 DIAGNOSIS — Z01419 Encounter for gynecological examination (general) (routine) without abnormal findings: Secondary | ICD-10-CM | POA: Diagnosis not present

## 2021-01-31 DIAGNOSIS — Z01411 Encounter for gynecological examination (general) (routine) with abnormal findings: Secondary | ICD-10-CM | POA: Diagnosis not present

## 2021-01-31 DIAGNOSIS — Z1231 Encounter for screening mammogram for malignant neoplasm of breast: Secondary | ICD-10-CM | POA: Diagnosis not present

## 2021-01-31 DIAGNOSIS — N959 Unspecified menopausal and perimenopausal disorder: Secondary | ICD-10-CM | POA: Diagnosis not present

## 2021-01-31 DIAGNOSIS — Z113 Encounter for screening for infections with a predominantly sexual mode of transmission: Secondary | ICD-10-CM | POA: Diagnosis not present

## 2021-01-31 DIAGNOSIS — Z124 Encounter for screening for malignant neoplasm of cervix: Secondary | ICD-10-CM | POA: Diagnosis not present

## 2021-02-01 MED FILL — PANTOPRAZOLE SOD DR 40 MG T: 40 | 28 days supply | Qty: 12 | Fill #6

## 2021-02-22 ENCOUNTER — Other Ambulatory Visit (HOSPITAL_BASED_OUTPATIENT_CLINIC_OR_DEPARTMENT_OTHER): Payer: Self-pay

## 2021-02-27 MED FILL — AZELAIC ACID 15 % GEL: 15 | 30 days supply | Qty: 50 | Fill #1

## 2021-02-27 MED FILL — MINOCYCLINE HCL 100 MG TABS: 100 | 30 days supply | Qty: 30 | Fill #1

## 2021-02-27 MED FILL — PANTOPRAZOLE SOD DR 40 MG T: 40 | 28 days supply | Qty: 12 | Fill #7

## 2021-03-22 ENCOUNTER — Other Ambulatory Visit (HOSPITAL_COMMUNITY): Payer: Self-pay

## 2021-03-22 MED FILL — Minocycline HCl Tab 100 MG: ORAL | 30 days supply | Qty: 30 | Fill #0 | Status: AC

## 2021-03-22 MED FILL — Valsartan-Hydrochlorothiazide Tab 80-12.5 MG: ORAL | 90 days supply | Qty: 90 | Fill #0 | Status: AC

## 2021-03-23 ENCOUNTER — Other Ambulatory Visit (HOSPITAL_COMMUNITY): Payer: Self-pay

## 2021-03-29 ENCOUNTER — Other Ambulatory Visit (HOSPITAL_COMMUNITY): Payer: Self-pay

## 2021-03-29 MED FILL — Azelaic Acid Gel 15%: CUTANEOUS | 30 days supply | Qty: 50 | Fill #0 | Status: CN

## 2021-03-30 ENCOUNTER — Other Ambulatory Visit (HOSPITAL_COMMUNITY): Payer: Self-pay

## 2021-03-30 MED FILL — Azelaic Acid Gel 15%: CUTANEOUS | 30 days supply | Qty: 50 | Fill #0 | Status: AC

## 2021-03-31 ENCOUNTER — Other Ambulatory Visit (HOSPITAL_COMMUNITY): Payer: Self-pay

## 2021-04-03 ENCOUNTER — Other Ambulatory Visit (HOSPITAL_COMMUNITY): Payer: Self-pay

## 2021-04-03 MED ORDER — PANTOPRAZOLE SODIUM 40 MG PO TBEC
DELAYED_RELEASE_TABLET | ORAL | 10 refills | Status: AC
  Filled 2021-04-03: qty 12, 28d supply, fill #0
  Filled 2021-05-07: qty 12, 28d supply, fill #1
  Filled 2021-06-27: qty 12, 28d supply, fill #2
  Filled 2021-08-21: qty 12, 28d supply, fill #3
  Filled 2021-09-21: qty 12, 28d supply, fill #4
  Filled 2021-11-19: qty 12, 28d supply, fill #5
  Filled 2021-12-18: qty 12, 28d supply, fill #6
  Filled 2022-02-14: qty 12, 28d supply, fill #7
  Filled 2022-03-21: qty 12, 28d supply, fill #8

## 2021-04-05 ENCOUNTER — Other Ambulatory Visit (HOSPITAL_COMMUNITY): Payer: Self-pay

## 2021-04-09 ENCOUNTER — Other Ambulatory Visit (HOSPITAL_COMMUNITY): Payer: Self-pay

## 2021-04-09 MED FILL — Rosuvastatin Calcium Tab 20 MG: ORAL | 90 days supply | Qty: 90 | Fill #0 | Status: AC

## 2021-04-09 MED FILL — Trazodone HCl Tab 50 MG: ORAL | 90 days supply | Qty: 180 | Fill #0 | Status: AC

## 2021-04-11 ENCOUNTER — Other Ambulatory Visit (HOSPITAL_COMMUNITY): Payer: Self-pay

## 2021-04-12 ENCOUNTER — Other Ambulatory Visit (HOSPITAL_COMMUNITY): Payer: Self-pay

## 2021-04-12 MED ORDER — BUPROPION HCL ER (XL) 300 MG PO TB24
1.0000 | ORAL_TABLET | Freq: Every morning | ORAL | 3 refills | Status: DC
  Filled 2021-04-12: qty 90, 90d supply, fill #0

## 2021-04-13 ENCOUNTER — Other Ambulatory Visit (HOSPITAL_COMMUNITY): Payer: Self-pay

## 2021-04-13 MED FILL — Minocycline HCl Tab 100 MG: ORAL | 30 days supply | Qty: 30 | Fill #1 | Status: AC

## 2021-04-14 ENCOUNTER — Other Ambulatory Visit (HOSPITAL_COMMUNITY): Payer: Self-pay

## 2021-04-16 ENCOUNTER — Other Ambulatory Visit (HOSPITAL_COMMUNITY): Payer: Self-pay

## 2021-04-23 ENCOUNTER — Other Ambulatory Visit: Payer: Self-pay

## 2021-04-23 ENCOUNTER — Ambulatory Visit: Payer: 59 | Attending: Internal Medicine

## 2021-04-23 ENCOUNTER — Other Ambulatory Visit (HOSPITAL_BASED_OUTPATIENT_CLINIC_OR_DEPARTMENT_OTHER): Payer: Self-pay

## 2021-04-23 DIAGNOSIS — Z23 Encounter for immunization: Secondary | ICD-10-CM

## 2021-04-23 MED ORDER — PFIZER-BIONT COVID-19 VAC-TRIS 30 MCG/0.3ML IM SUSP
INTRAMUSCULAR | 0 refills | Status: AC
  Filled 2021-04-23: qty 0.3, 1d supply, fill #0

## 2021-04-23 NOTE — Progress Notes (Signed)
   Covid-19 Vaccination Clinic  Name:  LAMOYNE PALENCIA    MRN: [id number] DOB: 1956-11-14  04/23/2021  Ms. Van was observed post Covid-19 immunization for 15 minutes without incident. She was provided with Vaccine Information Sheet and instruction to access the V-Safe system.   Ms. Nusser was instructed to call 911 with any severe reactions post vaccine: Marland Kitchen Difficulty breathing  . Swelling of face and throat  . A fast heartbeat  . A bad rash all over body  . Dizziness and weakness   Immunizations Administered    Name Date Dose VIS Date Route   PFIZER Comrnaty(Gray TOP) Covid-19 Vaccine 04/23/2021 12:56 PM 0.3 mL 11/09/2020 Intramuscular   Manufacturer: [place]   Lot: [id number]   [place]: [phone]

## 2021-05-07 ENCOUNTER — Other Ambulatory Visit (HOSPITAL_COMMUNITY): Payer: Self-pay

## 2021-05-07 MED FILL — Azelaic Acid Gel 15%: CUTANEOUS | 30 days supply | Qty: 50 | Fill #1 | Status: AC

## 2021-05-07 MED FILL — Estradiol TD Patch Twice Weekly 0.1 MG/24HR: TRANSDERMAL | 84 days supply | Qty: 24 | Fill #0 | Status: AC

## 2021-05-07 MED FILL — Minocycline HCl Tab 100 MG: ORAL | 30 days supply | Qty: 30 | Fill #2 | Status: AC

## 2021-05-08 ENCOUNTER — Other Ambulatory Visit (HOSPITAL_COMMUNITY): Payer: Self-pay

## 2021-05-08 MED ORDER — BETAMETHASONE DIPROPIONATE 0.05 % EX LOTN
TOPICAL_LOTION | CUTANEOUS | 0 refills | Status: DC
  Filled 2021-05-08: qty 60, 30d supply, fill #0

## 2021-05-09 ENCOUNTER — Other Ambulatory Visit (HOSPITAL_COMMUNITY): Payer: Self-pay

## 2021-05-10 ENCOUNTER — Other Ambulatory Visit (HOSPITAL_COMMUNITY): Payer: Self-pay

## 2021-05-14 DIAGNOSIS — R030 Elevated blood-pressure reading, without diagnosis of hypertension: Secondary | ICD-10-CM | POA: Diagnosis not present

## 2021-05-14 DIAGNOSIS — R3129 Other microscopic hematuria: Secondary | ICD-10-CM | POA: Diagnosis not present

## 2021-05-14 DIAGNOSIS — Z1231 Encounter for screening mammogram for malignant neoplasm of breast: Secondary | ICD-10-CM | POA: Diagnosis not present

## 2021-05-14 DIAGNOSIS — E785 Hyperlipidemia, unspecified: Secondary | ICD-10-CM | POA: Diagnosis not present

## 2021-05-14 DIAGNOSIS — F439 Reaction to severe stress, unspecified: Secondary | ICD-10-CM | POA: Diagnosis not present

## 2021-05-14 DIAGNOSIS — G4700 Insomnia, unspecified: Secondary | ICD-10-CM | POA: Diagnosis not present

## 2021-05-14 DIAGNOSIS — J309 Allergic rhinitis, unspecified: Secondary | ICD-10-CM | POA: Diagnosis not present

## 2021-05-14 DIAGNOSIS — E559 Vitamin D deficiency, unspecified: Secondary | ICD-10-CM | POA: Diagnosis not present

## 2021-05-14 DIAGNOSIS — Z0001 Encounter for general adult medical examination with abnormal findings: Secondary | ICD-10-CM | POA: Diagnosis not present

## 2021-05-14 DIAGNOSIS — Z1211 Encounter for screening for malignant neoplasm of colon: Secondary | ICD-10-CM | POA: Diagnosis not present

## 2021-05-21 DIAGNOSIS — R3129 Other microscopic hematuria: Secondary | ICD-10-CM | POA: Diagnosis not present

## 2021-05-21 DIAGNOSIS — R030 Elevated blood-pressure reading, without diagnosis of hypertension: Secondary | ICD-10-CM | POA: Diagnosis not present

## 2021-05-21 DIAGNOSIS — Z79899 Other long term (current) drug therapy: Secondary | ICD-10-CM | POA: Diagnosis not present

## 2021-05-21 DIAGNOSIS — E559 Vitamin D deficiency, unspecified: Secondary | ICD-10-CM | POA: Diagnosis not present

## 2021-05-21 DIAGNOSIS — E785 Hyperlipidemia, unspecified: Secondary | ICD-10-CM | POA: Diagnosis not present

## 2021-06-13 DIAGNOSIS — G4700 Insomnia, unspecified: Secondary | ICD-10-CM | POA: Diagnosis not present

## 2021-06-13 DIAGNOSIS — E785 Hyperlipidemia, unspecified: Secondary | ICD-10-CM | POA: Diagnosis not present

## 2021-06-13 DIAGNOSIS — R3129 Other microscopic hematuria: Secondary | ICD-10-CM | POA: Diagnosis not present

## 2021-06-13 DIAGNOSIS — E669 Obesity, unspecified: Secondary | ICD-10-CM | POA: Diagnosis not present

## 2021-06-13 DIAGNOSIS — R030 Elevated blood-pressure reading, without diagnosis of hypertension: Secondary | ICD-10-CM | POA: Diagnosis not present

## 2021-06-19 ENCOUNTER — Other Ambulatory Visit (HOSPITAL_COMMUNITY): Payer: Self-pay

## 2021-06-19 MED FILL — Minocycline HCl Tab 100 MG: ORAL | 30 days supply | Qty: 30 | Fill #3 | Status: CN

## 2021-06-20 ENCOUNTER — Other Ambulatory Visit (HOSPITAL_COMMUNITY): Payer: Self-pay

## 2021-06-20 MED ORDER — MINOCYCLINE HCL 100 MG PO CAPS
100.0000 mg | ORAL_CAPSULE | Freq: Every day | ORAL | 5 refills | Status: DC
  Filled 2021-06-20: qty 30, 30d supply, fill #0

## 2021-06-22 ENCOUNTER — Other Ambulatory Visit (HOSPITAL_COMMUNITY): Payer: Self-pay

## 2021-06-22 MED ORDER — VALSARTAN-HYDROCHLOROTHIAZIDE 80-12.5 MG PO TABS
1.0000 | ORAL_TABLET | Freq: Every day | ORAL | 3 refills | Status: DC
  Filled 2021-06-22: qty 90, 90d supply, fill #0

## 2021-06-27 ENCOUNTER — Other Ambulatory Visit (HOSPITAL_COMMUNITY): Payer: Self-pay

## 2021-06-28 ENCOUNTER — Other Ambulatory Visit (HOSPITAL_COMMUNITY): Payer: Self-pay

## 2021-06-28 ENCOUNTER — Other Ambulatory Visit: Payer: Self-pay

## 2021-06-28 DIAGNOSIS — E782 Mixed hyperlipidemia: Secondary | ICD-10-CM | POA: Diagnosis not present

## 2021-06-28 DIAGNOSIS — I10 Essential (primary) hypertension: Secondary | ICD-10-CM

## 2021-06-28 MED ORDER — VALSARTAN-HYDROCHLOROTHIAZIDE 80-12.5 MG PO TABS
1.0000 | ORAL_TABLET | Freq: Every day | ORAL | 3 refills | Status: DC
  Filled 2021-06-28 – 2021-07-03 (×2): qty 90, 90d supply, fill #0

## 2021-06-29 LAB — BASIC METABOLIC PANEL
BUN/Creatinine Ratio: 14 (ref 12–28)
BUN: 11 mg/dL (ref 8–27)
CO2: 24 mmol/L (ref 20–29)
Calcium: 9.7 mg/dL (ref 8.7–10.3)
Chloride: 101 mmol/L (ref 96–106)
Creatinine, Ser: 0.8 mg/dL (ref 0.57–1.00)
Glucose: 102 mg/dL — ABNORMAL HIGH (ref 65–99)
Potassium: 4.7 mmol/L (ref 3.5–5.2)
Sodium: 139 mmol/L (ref 134–144)
eGFR: 83 mL/min/{1.73_m2} (ref >59)

## 2021-06-29 LAB — LIPID PANEL WITH LDL/HDL RATIO
Cholesterol, Total: 193 mg/dL (ref 100–199)
HDL: 60 mg/dL (ref >39)
LDL Chol Calc (NIH): 104 mg/dL — ABNORMAL HIGH (ref 0–99)
LDL/HDL Ratio: 1.7 ratio (ref 0.0–3.2)
Triglycerides: 170 mg/dL — ABNORMAL HIGH (ref 0–149)
VLDL Cholesterol Cal: 29 mg/dL (ref 5–40)

## 2021-07-03 ENCOUNTER — Other Ambulatory Visit (HOSPITAL_COMMUNITY): Payer: Self-pay

## 2021-07-05 DIAGNOSIS — E782 Mixed hyperlipidemia: Secondary | ICD-10-CM | POA: Diagnosis not present

## 2021-07-05 DIAGNOSIS — I10 Essential (primary) hypertension: Secondary | ICD-10-CM | POA: Diagnosis not present

## 2021-07-05 NOTE — Progress Notes (Signed)
=    Patient referred by Josetta Huddle, MD for hyperlipidemia  Subjective:   Kathleen Wise, female    DOB: Jul 15, 1956, 65 y.o.   MRN: [id number]   Chief Complaint  Patient presents with   Hypertension   Hyperlipidemia   Follow-up    HPI  65 y.o. Caucasian female with hypertension, hyperlipidemia.  Patient has had myalgias since being on statin.  Other than that, he has no complaints.  Blood pressure is elevated today, but always well controlled at home historically.   Current Outpatient Medications on File Prior to Visit  Medication Sig Dispense Refill   Azelaic Acid 15 % cream APPLY TOPICALLY TO THE ENTIRE FACE TWO TIMES DAILY 50 g 2   Azelaic Acid 15 % gel APPLY TOPICALLY TO THE ENTIRE FACE 2 TIMES DAILY 50 g 10   betamethasone dipropionate 0.05 % lotion APPLY TOPICALLY ONTO THE SKIN FOR 6 WEEKS 60 mL 0   Biotin 10 MG CAPS Take 1 capsule by mouth daily.     buPROPion (WELLBUTRIN XL) 300 MG 24 hr tablet TAKE 1 TABLET BY MOUTH EVERY MORNING 90 tablet 3   cetirizine (ZYRTEC) 10 MG tablet Take 10 mg by mouth daily as needed.     cholecalciferol (VITAMIN D3) 25 MCG (1000 UT) tablet Take 1,000 Units by mouth daily.     clindamycin (CLINDAGEL) 1 % gel Apply 1 application topically 2 (two) times daily. To the face  9   Coenzyme Q10 (COQ10) 200 MG CAPS Take 1 tablet by mouth daily at 6 (six) AM.     COVID-19 mRNA Vac-TriS, Pfizer, (PFIZER-BIONT COVID-19 VAC-TRIS) SUSP injection Inject into the muscle. 0.3 mL 0   estradiol (VIVELLE-DOT) 0.1 MG/24HR patch Place 1 patch onto the skin 2 (two) times a week. Switch out on Every Sunday and Thursday,     minocycline (DYNACIN) 100 MG tablet TAKE 1 TABLET BY MOUTH DAILY AS NEEDED FOR FLARES. TAKE WITH FOOD AND FULL GLASS OF WATER 30 tablet 5   pantoprazole (PROTONIX) 40 MG tablet TAKE 1 TABLET BY MOUTH 3 TIMES WEEKLY (Patient taking differently: Take 20 mg by mouth daily.) 12 tablet 10   rosuvastatin (CRESTOR) 20 MG tablet TAKE 1 TABLET BY MOUTH  ONCE A DAY 90 tablet 3   traZODone (DESYREL) 50 MG tablet TAKE 1-2 TABLETS BY MOUTH ONCE DAILY AT BEDTIME AS NEEDED FOR SLEEP 180 tablet 0   valsartan-hydrochlorothiazide (DIOVAN-HCT) 80-12.5 MG tablet Take 1 tablet by mouth daily.     No current facility-administered medications on file prior to visit.    Cardiovascular studies:  EKG 07/06/2021: Sinus rhythm 79 bpm Low voltage in precordial leads  Recent labs: 06/28/2021: Glucose 102, BUN/Cr 11/0.8. EGFR 83. Na/K 139/4.7.  Chol 193, TG 170, HDL 60, LDL 104  05/24/2020: Chol 187, TG 155, HDL 63, LDL 97.  12/10/2019: Chol 191, TG 273, HDL 61, LDL 85.   07/07/2019: Glucose 85. BUN/Cr 14/0.77. eGFR 76. Na/K 139/4.5. Rest of the CMP normal.  H/H 13.9/40.4. MCV 94. Platelets 202.  Chol 268, TG 235, HDL 57, LDL 164.  02/11/2018: Glucose 92. BUN/Cr 12/0.75. eGFR 79. Na/K 139/4.7. Rest of the CMP normal.     Review of Systems  Cardiovascular:  Negative for chest pain, dyspnea on exertion, leg swelling, palpitations and syncope.  Musculoskeletal:  Positive for myalgias.      Vitals:   07/06/21 0900  BP: (!) 146/76  Pulse: 81  Resp: 16  Temp: 98 F (36.7 C)  SpO2: 98%  Body mass index is 33.45 kg/m. Filed Weights   07/06/21 0900  Weight: 220 lb (99.8 kg)     Objective:   Physical Exam Vitals and nursing note reviewed.  Constitutional:      General: She is not in acute distress. Neck:     Vascular: No JVD.  Cardiovascular:     Rate and Rhythm: Normal rate and regular rhythm.     Heart sounds: Normal heart sounds. No murmur heard. Pulmonary:     Effort: Pulmonary effort is normal.     Breath sounds: Normal breath sounds. No wheezing or rales.  Musculoskeletal:     Right lower leg: No edema.     Left lower leg: No edema.          Assessment & Recommendations:   65y.o. Caucasian female with hypertension, hyperlipidemia.  Hyperlipidemia: LDL up to 104, HDL is 60.  10-year ASCVD risk score is 5.8%.  I  will obtain calcium score scan for risk stratification.  If calcium score is 0, okay to come off statin and repeat lipid panel in 3 months.  If calcium score is elevated, will try alternate statin or nonstatin.    Hypertension: Known white coat hypertension. No changes made to antihypertensive therapy.   F/u in 3 months  Kathleen Town, MD [facility] Pager: [phone] Office: [phone] If no answer Cell [phone]

## 2021-07-06 ENCOUNTER — Other Ambulatory Visit: Payer: Self-pay

## 2021-07-06 ENCOUNTER — Encounter: Payer: Self-pay | Admitting: Cardiology

## 2021-07-06 ENCOUNTER — Ambulatory Visit: Payer: 59 | Admitting: Cardiology

## 2021-07-06 VITALS — BP 146/76 | HR 81 | Temp 98.0°F | Resp 16 | Ht 68.0 in | Wt 220.0 lb

## 2021-07-06 DIAGNOSIS — E782 Mixed hyperlipidemia: Secondary | ICD-10-CM

## 2021-07-06 DIAGNOSIS — I10 Essential (primary) hypertension: Secondary | ICD-10-CM

## 2021-07-09 ENCOUNTER — Other Ambulatory Visit (HOSPITAL_COMMUNITY): Payer: Self-pay

## 2021-07-09 MED ORDER — BUPROPION HCL ER (XL) 300 MG PO TB24
300.0000 mg | ORAL_TABLET | Freq: Every morning | ORAL | 3 refills | Status: DC
  Filled 2021-07-09: qty 90, 90d supply, fill #0
  Filled 2021-10-09: qty 90, 90d supply, fill #1
  Filled 2022-01-24: qty 90, 90d supply, fill #2
  Filled 2022-05-07 – 2022-05-25 (×2): qty 90, 90d supply, fill #3

## 2021-07-09 MED FILL — Azelaic Acid Gel 15%: CUTANEOUS | 30 days supply | Qty: 50 | Fill #2 | Status: AC

## 2021-07-11 ENCOUNTER — Ambulatory Visit: Payer: 59 | Admitting: Cardiology

## 2021-07-27 ENCOUNTER — Other Ambulatory Visit (HOSPITAL_COMMUNITY): Payer: Self-pay

## 2021-07-27 MED FILL — Minocycline HCl Tab 100 MG: ORAL | 30 days supply | Qty: 30 | Fill #3 | Status: CN

## 2021-07-30 ENCOUNTER — Other Ambulatory Visit (HOSPITAL_COMMUNITY): Payer: Self-pay

## 2021-07-30 MED ORDER — MINOCYCLINE HCL 100 MG PO CAPS
ORAL_CAPSULE | ORAL | 5 refills | Status: DC
  Filled 2021-07-30: qty 30, 30d supply, fill #0
  Filled 2021-08-29: qty 30, 30d supply, fill #1
  Filled 2021-09-25: qty 30, 30d supply, fill #2
  Filled 2021-10-22: qty 30, 30d supply, fill #3
  Filled 2021-11-19: qty 30, 30d supply, fill #4
  Filled 2021-12-18: qty 30, 30d supply, fill #5

## 2021-08-02 ENCOUNTER — Ambulatory Visit
Admission: RE | Admit: 2021-08-02 | Discharge: 2021-08-02 | Disposition: A | Discharge status: Home / Self Care | Payer: No Typology Code available for payment source | Source: Ambulatory Visit | Attending: Cardiology | Admitting: Cardiology

## 2021-08-02 DIAGNOSIS — E782 Mixed hyperlipidemia: Secondary | ICD-10-CM

## 2021-08-02 NOTE — Progress Notes (Signed)
I reviewed your CT cardiac scoring scan. There is moderate amount of calcium in one artery. Given your LDL of 164, I do recommend starting statin therapy to reduce long term cardiac risk. I would recommend starting Crestor 20 mg daily.   If patient agreeable, please send Crestor 20 mg 30 pillsX3refills.  Thanks MJP

## 2021-08-07 NOTE — Progress Notes (Signed)
Spoke with patient, she stated that she already takes Crestor '20mg'$  and the reason you were doing this test was to see if she could come off the Crestor, because she has severe body aches. Please advise.

## 2021-08-13 ENCOUNTER — Other Ambulatory Visit (HOSPITAL_BASED_OUTPATIENT_CLINIC_OR_DEPARTMENT_OTHER): Payer: Self-pay

## 2021-08-13 ENCOUNTER — Other Ambulatory Visit (HOSPITAL_COMMUNITY): Payer: Self-pay

## 2021-08-13 MED ORDER — ESTRADIOL 0.1 MG/24HR TD PTTW
MEDICATED_PATCH | TRANSDERMAL | 1 refills | Status: DC
  Filled 2021-08-13 (×2): qty 24, 84d supply, fill #0
  Filled 2021-11-19: qty 24, 84d supply, fill #1

## 2021-08-14 ENCOUNTER — Other Ambulatory Visit (HOSPITAL_COMMUNITY): Payer: Self-pay

## 2021-08-15 ENCOUNTER — Other Ambulatory Visit (HOSPITAL_COMMUNITY): Payer: Self-pay

## 2021-08-15 MED FILL — Rosuvastatin Calcium Tab 20 MG: ORAL | 90 days supply | Qty: 90 | Fill #1 | Status: AC

## 2021-08-21 ENCOUNTER — Other Ambulatory Visit (HOSPITAL_COMMUNITY): Payer: Self-pay

## 2021-08-28 DIAGNOSIS — H40013 Open angle with borderline findings, low risk, bilateral: Secondary | ICD-10-CM | POA: Diagnosis not present

## 2021-08-30 ENCOUNTER — Other Ambulatory Visit (HOSPITAL_COMMUNITY): Payer: Self-pay

## 2021-09-14 ENCOUNTER — Other Ambulatory Visit (HOSPITAL_COMMUNITY): Payer: Self-pay

## 2021-09-14 DIAGNOSIS — K648 Other hemorrhoids: Secondary | ICD-10-CM | POA: Diagnosis not present

## 2021-09-14 MED ORDER — HYDROCORTISONE ACETATE 25 MG RE SUPP
RECTAL | 1 refills | Status: DC
  Filled 2021-09-14: qty 7, 7d supply, fill #0

## 2021-09-21 ENCOUNTER — Other Ambulatory Visit (HOSPITAL_COMMUNITY): Payer: Self-pay

## 2021-09-25 ENCOUNTER — Other Ambulatory Visit (HOSPITAL_COMMUNITY): Payer: Self-pay

## 2021-09-26 ENCOUNTER — Other Ambulatory Visit (HOSPITAL_COMMUNITY): Payer: Self-pay

## 2021-09-26 MED ORDER — BETAMETHASONE DIPROPIONATE 0.05 % EX LOTN
TOPICAL_LOTION | CUTANEOUS | 1 refills | Status: AC
  Filled 2021-09-26: qty 60, 42d supply, fill #0
  Filled 2022-04-11: qty 60, 42d supply, fill #1

## 2021-09-27 ENCOUNTER — Other Ambulatory Visit (HOSPITAL_COMMUNITY): Payer: Self-pay

## 2021-10-04 ENCOUNTER — Other Ambulatory Visit (HOSPITAL_COMMUNITY): Payer: Self-pay

## 2021-10-04 MED ORDER — VALSARTAN-HYDROCHLOROTHIAZIDE 80-12.5 MG PO TABS
1.0000 | ORAL_TABLET | Freq: Every day | ORAL | 3 refills | Status: DC
  Filled 2021-10-04: qty 90, 90d supply, fill #0
  Filled 2022-01-08: qty 90, 90d supply, fill #1
  Filled 2022-04-11: qty 90, 90d supply, fill #2
  Filled 2022-06-28: qty 90, 90d supply, fill #3

## 2021-10-05 ENCOUNTER — Other Ambulatory Visit (HOSPITAL_COMMUNITY): Payer: Self-pay

## 2021-10-08 ENCOUNTER — Ambulatory Visit: Payer: 59 | Admitting: Cardiology

## 2021-10-09 ENCOUNTER — Other Ambulatory Visit (HOSPITAL_COMMUNITY): Payer: Self-pay

## 2021-10-09 MED FILL — Trazodone HCl Tab 50 MG: ORAL | 90 days supply | Qty: 180 | Fill #0 | Status: AC

## 2021-10-09 MED FILL — Azelaic Acid Gel 15%: CUTANEOUS | 30 days supply | Qty: 50 | Fill #3 | Status: AC

## 2021-10-11 ENCOUNTER — Ambulatory Visit: Payer: 59 | Admitting: Cardiology

## 2021-10-11 IMAGING — CT CT CARDIAC CORONARY ARTERY CALCIUM SCORE
3 series · 14 of 20 positions shown, 16 images · non-contrast
Comparison: None.

CLINICAL DATA: 65-year-old Caucasian female with history
hyperlipidemia, hypertension and family history of heart disease.

EXAM:
CT CARDIAC CORONARY ARTERY CALCIUM SCORE
TECHNIQUE: Non-contrast imaging through the heart was performed using
prospective ECG gating. Image post processing was performed on an
independent workstation, allowing for quantitative analysis of the
heart and coronary arteries. Note that this exam targets the heart
and the chest was not imaged in its entirety.

[Series 2: calcium scoring 2.00 qr36 bestdiast 70% hrt calciu · axial · 0.40mm/px · z∈[+1777,+1843]mm · 4 of 57 slices shown]
[im 12/57  vessel]
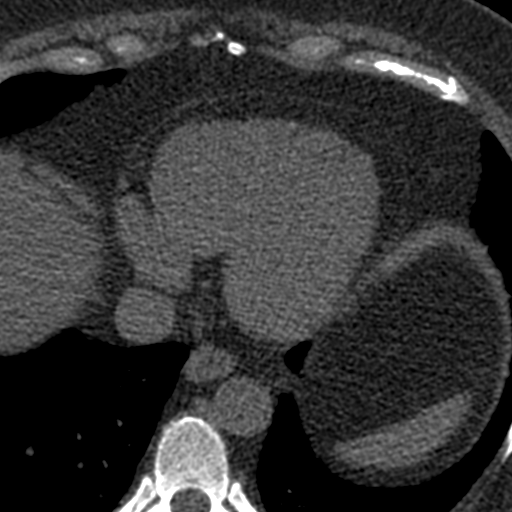
[im 23/57  vessel]
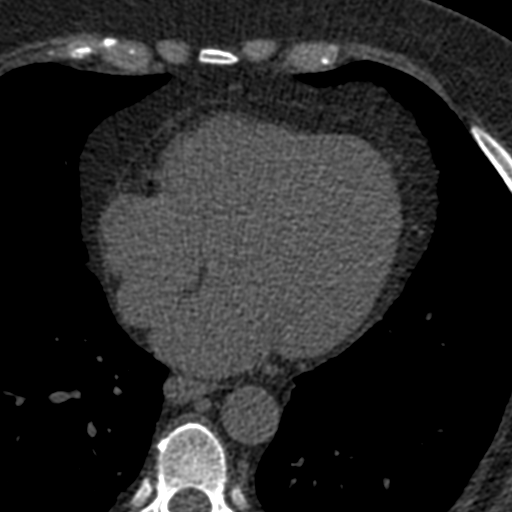
[im 34/57  vessel]
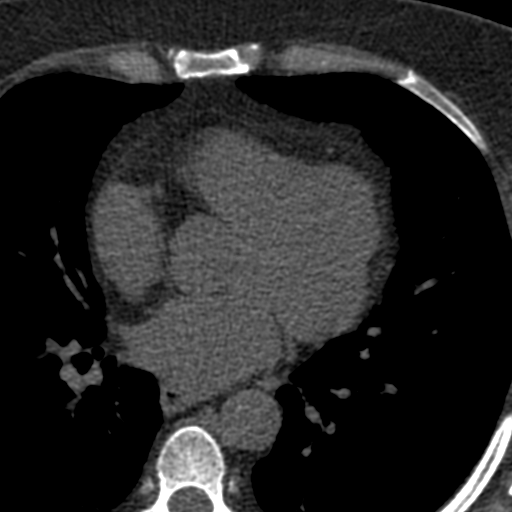
[im 45/57  vessel]
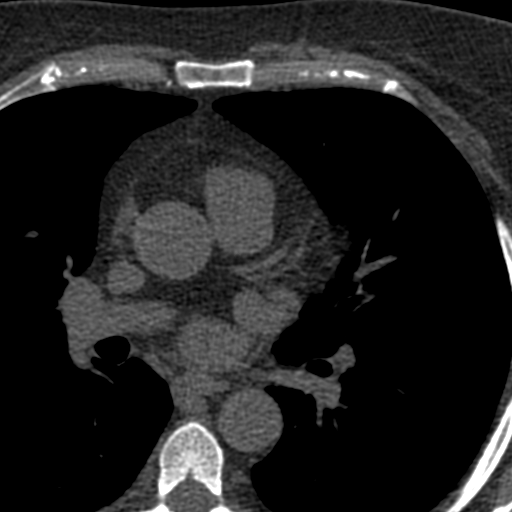

[Series 3: calcium scoring 2.00 br40 bestdiast 70% axial · axial · 0.66mm/px · z∈[+1773,+1847]mm · 5 of 57 slices shown, 7 images]
[im 10/57  vessel]
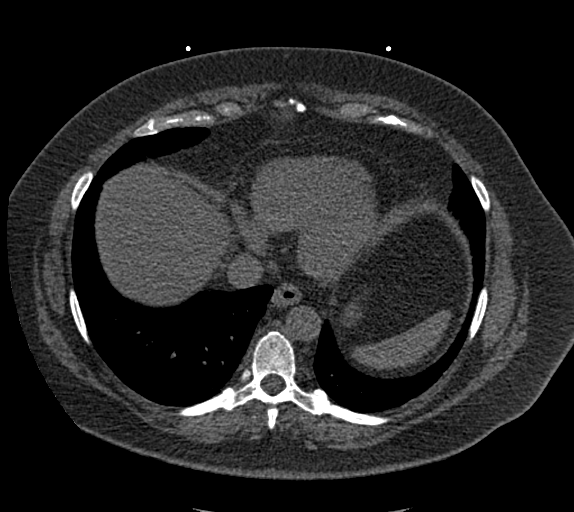
[im 10/57  lung]
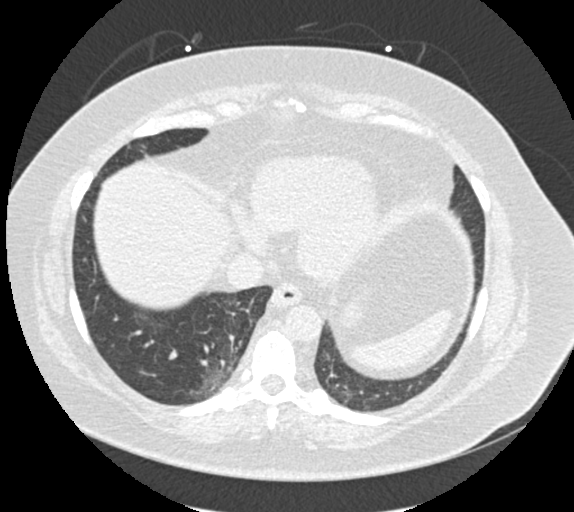
[im 19/57  vessel]
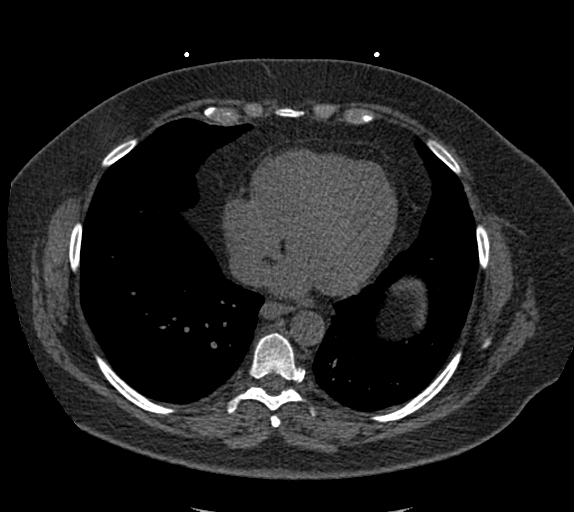
[im 29/57  vessel]
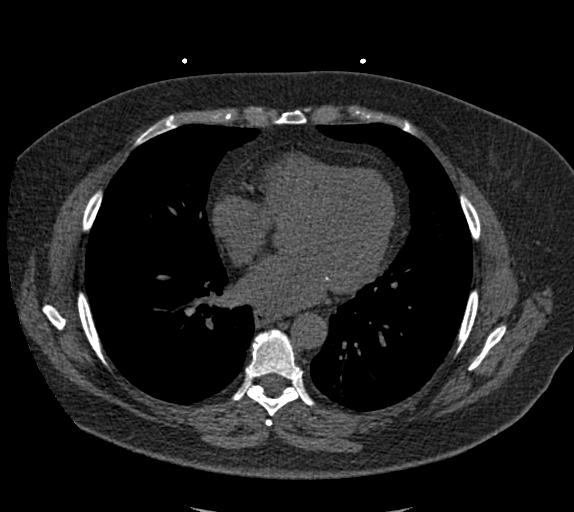
[im 38/57  vessel]
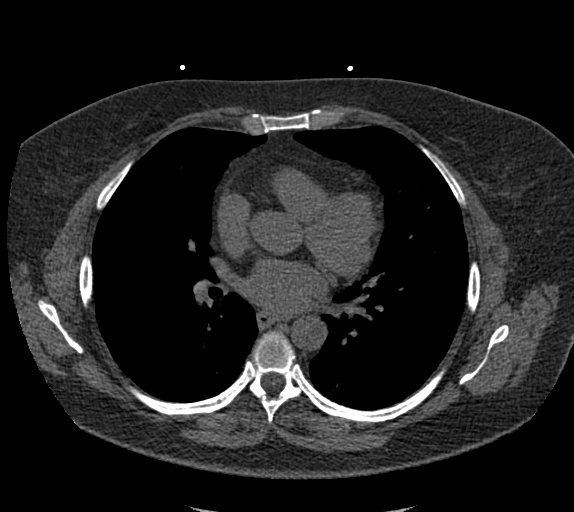
[im 47/57  vessel]
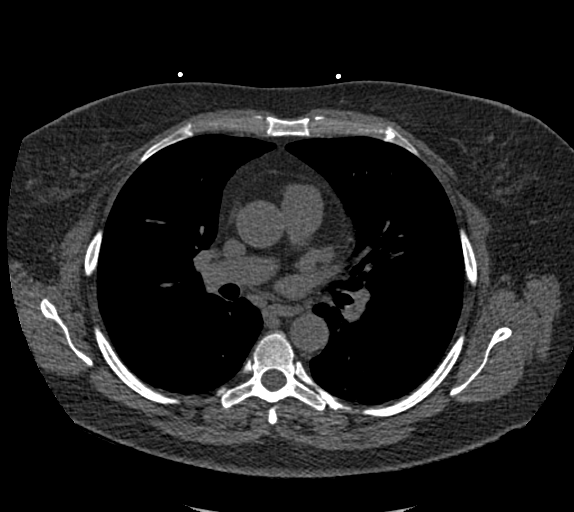
[im 47/57  lung]
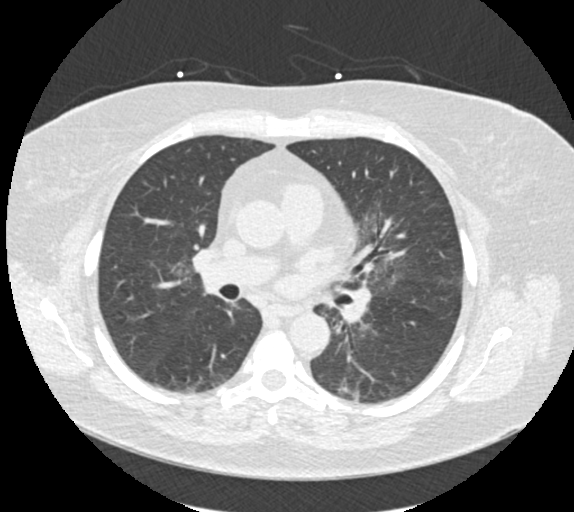

[Series 9: calcium scoring 2.00 br60 bestdiast 70% lungs · axial · 0.66mm/px · z∈[+1773,+1847]mm · 5 of 57 slices shown]
[im 10/57  vessel]
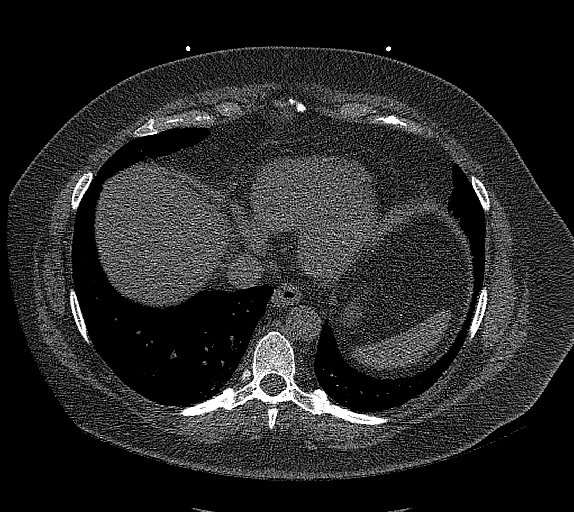
[im 19/57  vessel]
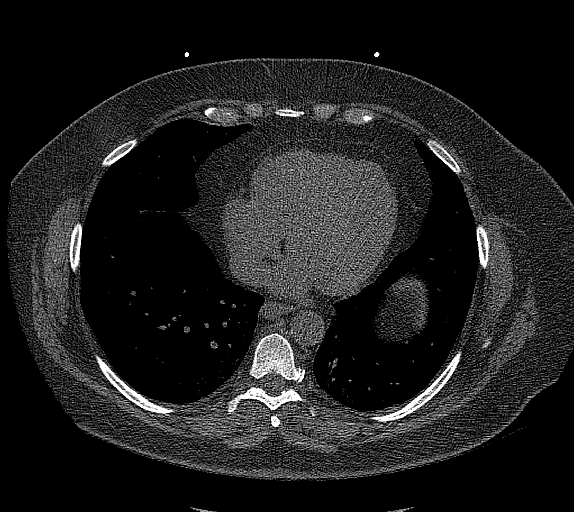
[im 29/57  vessel]
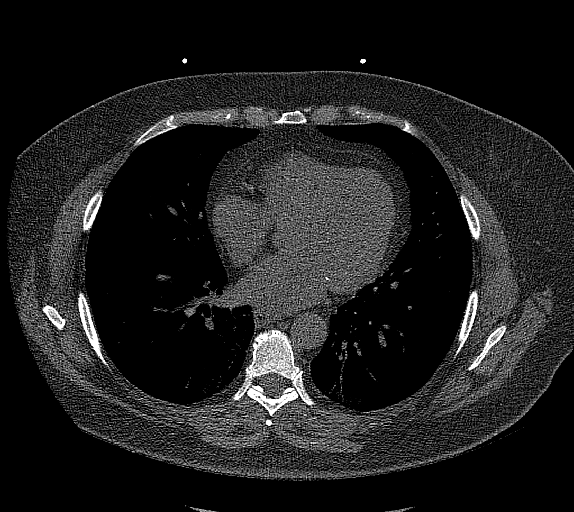
[im 38/57  vessel]
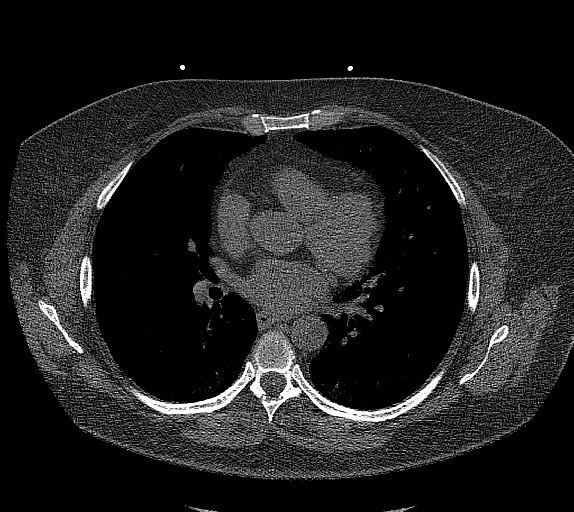
[im 47/57  vessel]
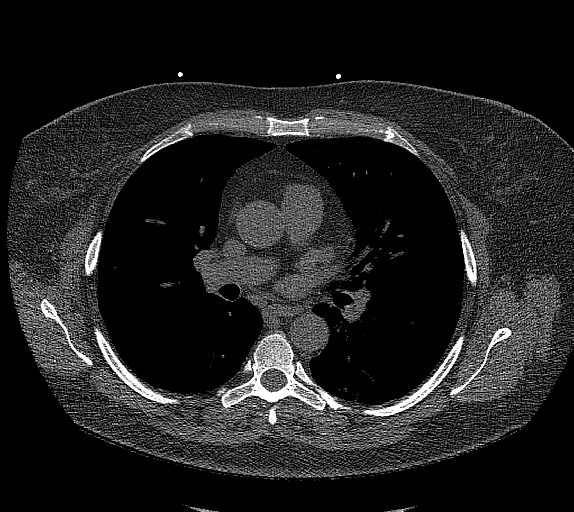

[14 of 20 positions shown; findings below may reference images not displayed]

FINDINGS: CORONARY CALCIUM SCORES:

Left Main: 0

LAD: 0

LCx: 12

RCA: 0

Total Agatston Score: 12

[HOSPITAL] percentile: 65

AORTA MEASUREMENTS:

Ascending Aorta: 30 mm

Descending Aorta: 24 mm

OTHER FINDINGS:

The heart size is within normal limits. No pericardial fluid is
identified. Visualized segments of the thoracic aorta and central
pulmonary arteries are normal in caliber. Visualized mediastinum and
hilar regions demonstrate no lymphadenopathy or masses. There is a
calcified granuloma of the right lower lobe just posterior to the
major fissure. Lungs demonstrate scattered areas of irregular
ground-glass opacity predominantly in the lower lobes but also the
lingula which may reflect scarring from prior infection or chronic
disease. Correlation suggested with any history of pulmonary disease
or infection. Visualized lungs show no evidence of pulmonary edema,
pneumothorax or pleural fluid. Visualized upper abdomen and bony
structures are unremarkable.
IMPRESSION: 1. Coronary calcium score of 12 is at the 65th percentile for the
patient's age, sex and race.
2. Calcified granuloma of the right lower lobe is consistent with
prior granulomatous disease.
3. Nonspecific irregular ground-glass opacities in the lower lobes
and lingula which have the appearance of scarring. This may relate
to prior infection, inflammation or chronic disease. Correlation
suggested with any history of pulmonary disease or infection.

## 2021-10-22 ENCOUNTER — Other Ambulatory Visit (HOSPITAL_COMMUNITY): Payer: Self-pay

## 2021-11-05 ENCOUNTER — Ambulatory Visit: Payer: 59 | Admitting: Cardiology

## 2021-11-05 ENCOUNTER — Other Ambulatory Visit: Payer: Self-pay

## 2021-11-05 ENCOUNTER — Encounter: Payer: Self-pay | Admitting: Cardiology

## 2021-11-05 VITALS — BP 140/67 | HR 70 | Temp 98.0°F | Resp 16 | Ht 68.0 in | Wt 224.0 lb

## 2021-11-05 DIAGNOSIS — I10 Essential (primary) hypertension: Secondary | ICD-10-CM

## 2021-11-05 DIAGNOSIS — R931 Abnormal findings on diagnostic imaging of heart and coronary circulation: Secondary | ICD-10-CM

## 2021-11-05 DIAGNOSIS — E782 Mixed hyperlipidemia: Secondary | ICD-10-CM

## 2021-11-05 NOTE — Progress Notes (Signed)
=    Patient referred by Josetta Huddle, MD for hyperlipidemia  Subjective:   Kathleen Wise, female    DOB: June 22, 1956, 65 y.o.   MRN: [id number]   Chief Complaint  Patient presents with   Hypertension   Hyperlipidemia   Follow-up    3 month    HPI  65 y.o. Caucasian female with hypertension, hyperlipidemia.  Reviewed recent cardiac CT scoring results with patient, details below.  Patient is currently taking rosuvastatin 40 mg.  This, she has significant myalgia symptoms in the morning, but gets better as the day goes on.  She denies any other complaints at this time.   Current Outpatient Medications on File Prior to Visit  Medication Sig Dispense Refill   Azelaic Acid 15 % gel APPLY TOPICALLY TO THE ENTIRE FACE 2 TIMES DAILY 50 g 10   betamethasone dipropionate 0.05 % lotion APPLY TOPICALLY ONTO THE SKIN FOR 6 WEEKS 60 mL 1   Biotin 10 MG CAPS Take 1 capsule by mouth daily.     buPROPion (WELLBUTRIN XL) 300 MG 24 hr tablet TAKE 1 TABLET BY MOUTH EVERY MORNING 90 tablet 3   buPROPion (WELLBUTRIN XL) 300 MG 24 hr tablet TAKE 1 TABLET BY MOUTH EVERY MORNING 90 tablet 3   cetirizine (ZYRTEC) 10 MG tablet Take 10 mg by mouth daily as needed.     cholecalciferol (VITAMIN D3) 25 MCG (1000 UT) tablet Take 1,000 Units by mouth daily.     clindamycin (CLINDAGEL) 1 % gel Apply 1 application topically 2 (two) times daily. To the face  9   Coenzyme Q10 (COQ10) 200 MG CAPS Take 1 tablet by mouth daily at 6 (six) AM.     COVID-19 mRNA Vac-TriS, Pfizer, (PFIZER-BIONT COVID-19 VAC-TRIS) SUSP injection Inject into the muscle. 0.3 mL 0   estradiol (VIVELLE-DOT) 0.1 MG/24HR patch Place 1 patch onto the skin 2 (two) times a week. Switch out on Every Sunday and Thursday,     estradiol (VIVELLE-DOT) 0.1 MG/24HR patch APPLY 1 PATCH 2 TIMES A WEEK. 24 patch 1   hydrocortisone (ANUSOL-HC) 25 MG suppository Unwrap and insert 1 suppository rectally once a day for 5 to 7 days as directed 7 suppository 1    minocycline (DYNACIN) 100 MG tablet TAKE 1 TABLET BY MOUTH DAILY AS NEEDED FOR FLARES. TAKE WITH FOOD AND FULL GLASS OF WATER 30 tablet 5   minocycline (MINOCIN) 100 MG capsule Take 1 capsule by mouth once a day as needed for flares.  Take with food and a full glass of water 30 capsule 5   pantoprazole (PROTONIX) 40 MG tablet TAKE 1 TABLET BY MOUTH 3 TIMES WEEKLY (Patient taking differently: Take 20 mg by mouth daily.) 12 tablet 10   rosuvastatin (CRESTOR) 20 MG tablet TAKE 1 TABLET BY MOUTH ONCE A DAY 90 tablet 3   traZODone (DESYREL) 50 MG tablet TAKE 1-2 TABLETS BY MOUTH ONCE DAILY AT BEDTIME AS NEEDED FOR SLEEP 180 tablet 0   valsartan-hydrochlorothiazide (DIOVAN-HCT) 80-12.5 MG tablet Take 1 tablet by mouth daily.     valsartan-hydrochlorothiazide (DIOVAN-HCT) 80-12.5 MG tablet Take 1 tablet by mouth daily. 90 tablet 3   No current facility-administered medications on file prior to visit.    Cardiovascular studies:  CT cardiac scoring 08/02/2021: 1. Coronary calcium score of 12 (all in Lcx) is at the 65th percentile for the patient's age, sex and race. 2. Calcified granuloma of the right lower lobe is consistent with prior granulomatous disease. 3. Nonspecific irregular ground-glass  opacities in the lower lobes and lingula which have the appearance of scarring. This may relate to prior infection, inflammation or chronic disease. Correlation suggested with any history of pulmonary disease or infection.    EKG 07/06/2021: Sinus rhythm 79 bpm Low voltage in precordial leads  Recent labs: 06/28/2021: Glucose 102, BUN/Cr 11/0.8. EGFR 83. Na/K 139/4.7.  Chol 193, TG 170, HDL 60, LDL 104  05/24/2020: Chol 187, TG 155, HDL 63, LDL 97.  12/10/2019: Chol 191, TG 273, HDL 61, LDL 85.   07/07/2019: Glucose 85. BUN/Cr 14/0.77. eGFR 76. Na/K 139/4.5. Rest of the CMP normal.  H/H 13.9/40.4. MCV 94. Platelets 202.  Chol 268, TG 235, HDL 57, LDL 164.  02/11/2018: Glucose 92. BUN/Cr 12/0.75.  eGFR 79. Na/K 139/4.7. Rest of the CMP normal.     Review of Systems  Cardiovascular:  Negative for chest pain, dyspnea on exertion, leg swelling, palpitations and syncope.  Musculoskeletal:  Positive for myalgias.      Vitals:   11/05/21 1404  BP: 140/67  Pulse: 70  Resp: 16  Temp: 98 F (36.7 C)  SpO2: 96%     Body mass index is 34.06 kg/m. Filed Weights   11/05/21 1404  Weight: 224 lb (101.6 kg)     Objective:   Physical Exam Vitals and nursing note reviewed.  Constitutional:      General: She is not in acute distress. Neck:     Vascular: No JVD.  Cardiovascular:     Rate and Rhythm: Normal rate and regular rhythm.     Heart sounds: Normal heart sounds. No murmur heard. Pulmonary:     Effort: Pulmonary effort is normal.     Breath sounds: Normal breath sounds. No wheezing or rales.  Musculoskeletal:     Right lower leg: No edema.     Left lower leg: No edema.          Assessment & Recommendations:   66 y.o. Caucasian female with hypertension, hyperlipidemia.  Mixed hyperlipidemia: LDL 104, HDL 60 (06/2021) Calcium score 12 (08/2021)  Currently on Crestor 40 mg daily.  We will repeat lipid panel and consider if he can reduce rosuvastatin dose.    Hypertension: Well-controlled  F/u in 6 months   Narjis Mira Esther Hardy, MD [facility] Pager: [phone] Office: [phone] If no answer Cell [phone]

## 2021-11-12 ENCOUNTER — Other Ambulatory Visit (HOSPITAL_COMMUNITY): Payer: Self-pay

## 2021-11-12 DIAGNOSIS — R051 Acute cough: Secondary | ICD-10-CM | POA: Diagnosis not present

## 2021-11-12 DIAGNOSIS — J069 Acute upper respiratory infection, unspecified: Secondary | ICD-10-CM | POA: Diagnosis not present

## 2021-11-12 DIAGNOSIS — Z03818 Encounter for observation for suspected exposure to other biological agents ruled out: Secondary | ICD-10-CM | POA: Diagnosis not present

## 2021-11-19 ENCOUNTER — Other Ambulatory Visit (HOSPITAL_COMMUNITY): Payer: Self-pay

## 2021-11-19 MED FILL — Rosuvastatin Calcium Tab 20 MG: ORAL | 90 days supply | Qty: 90 | Fill #2 | Status: AC

## 2021-12-12 ENCOUNTER — Other Ambulatory Visit (HOSPITAL_COMMUNITY): Payer: Self-pay

## 2021-12-12 DIAGNOSIS — R059 Cough, unspecified: Secondary | ICD-10-CM | POA: Diagnosis not present

## 2021-12-12 MED ORDER — BENZONATATE 100 MG PO CAPS
ORAL_CAPSULE | ORAL | 0 refills | Status: AC
  Filled 2021-12-12: qty 42, 14d supply, fill #0

## 2021-12-18 ENCOUNTER — Other Ambulatory Visit (HOSPITAL_COMMUNITY): Payer: Self-pay

## 2021-12-25 ENCOUNTER — Other Ambulatory Visit (HOSPITAL_COMMUNITY): Payer: Self-pay

## 2021-12-25 MED ORDER — PREDNISONE 10 MG (21) PO TBPK
ORAL_TABLET | ORAL | 0 refills | Status: DC
  Filled 2021-12-25: qty 21, 6d supply, fill #0

## 2021-12-25 MED ORDER — AZITHROMYCIN 250 MG PO TABS
ORAL_TABLET | ORAL | 0 refills | Status: DC
  Filled 2021-12-25: qty 6, 5d supply, fill #0

## 2022-01-08 ENCOUNTER — Other Ambulatory Visit (HOSPITAL_COMMUNITY): Payer: Self-pay

## 2022-01-09 ENCOUNTER — Other Ambulatory Visit (HOSPITAL_COMMUNITY): Payer: Self-pay

## 2022-01-24 ENCOUNTER — Other Ambulatory Visit (HOSPITAL_COMMUNITY): Payer: Self-pay

## 2022-01-28 ENCOUNTER — Other Ambulatory Visit (HOSPITAL_COMMUNITY): Payer: Self-pay

## 2022-01-28 MED ORDER — MINOCYCLINE HCL 100 MG PO CAPS
ORAL_CAPSULE | ORAL | 5 refills | Status: DC
  Filled 2022-01-28: qty 30, 30d supply, fill #0

## 2022-02-14 ENCOUNTER — Other Ambulatory Visit: Payer: Self-pay | Admitting: Cardiology

## 2022-02-14 ENCOUNTER — Other Ambulatory Visit (HOSPITAL_COMMUNITY): Payer: Self-pay

## 2022-02-15 ENCOUNTER — Other Ambulatory Visit (HOSPITAL_COMMUNITY): Payer: Self-pay

## 2022-02-15 MED ORDER — ROSUVASTATIN CALCIUM 20 MG PO TABS
ORAL_TABLET | Freq: Every day | ORAL | 3 refills | Status: DC
  Filled 2022-02-15: qty 90, 90d supply, fill #0
  Filled 2022-05-27: qty 90, 90d supply, fill #1
  Filled 2022-08-20: qty 90, 90d supply, fill #2

## 2022-02-17 ENCOUNTER — Other Ambulatory Visit: Payer: Self-pay

## 2022-02-17 ENCOUNTER — Emergency Department (HOSPITAL_COMMUNITY)
Admission: EM | Admit: 2022-02-17 | Discharge: 2022-02-17 | Disposition: A | Discharge status: Home / Self Care | Payer: 59 | Attending: Emergency Medicine | Admitting: Emergency Medicine

## 2022-02-17 ENCOUNTER — Encounter: Payer: Self-pay | Admitting: Emergency Medicine

## 2022-02-17 ENCOUNTER — Emergency Department (HOSPITAL_COMMUNITY): Payer: 59

## 2022-02-17 ENCOUNTER — Ambulatory Visit
Admission: EM | Admit: 2022-02-17 | Discharge: 2022-02-17 | Disposition: A | Discharge status: Home / Self Care | Payer: 59

## 2022-02-17 ENCOUNTER — Encounter (HOSPITAL_COMMUNITY): Payer: Self-pay

## 2022-02-17 DIAGNOSIS — R100 Acute abdomen: Secondary | ICD-10-CM

## 2022-02-17 DIAGNOSIS — R1032 Left lower quadrant pain: Secondary | ICD-10-CM | POA: Diagnosis not present

## 2022-02-17 DIAGNOSIS — K5792 Diverticulitis of intestine, part unspecified, without perforation or abscess without bleeding: Secondary | ICD-10-CM | POA: Diagnosis not present

## 2022-02-17 LAB — CBC WITH DIFFERENTIAL/PLATELET
Abs Immature Granulocytes: 0.02 10*3/uL (ref 0.00–0.07)
Basophils Absolute: 0.1 10*3/uL (ref 0.0–0.1)
Basophils Relative: 1 %
Eosinophils Absolute: 0 10*3/uL (ref 0.0–0.5)
Eosinophils Relative: 0 %
HCT: 42 % (ref 36.0–46.0)
Hemoglobin: 13.9 g/dL (ref 12.0–15.0)
Immature Granulocytes: 0 %
Lymphocytes Relative: 21 %
Lymphs Abs: 1.5 10*3/uL (ref 0.7–4.0)
MCH: 32 pg (ref 26.0–34.0)
MCHC: 33.1 g/dL (ref 30.0–36.0)
MCV: 96.8 fL (ref 80.0–100.0)
Monocytes Absolute: 0.6 10*3/uL (ref 0.1–1.0)
Monocytes Relative: 8 %
Neutro Abs: 5.2 10*3/uL (ref 1.7–7.7)
Neutrophils Relative %: 70 %
Platelets: 189 10*3/uL (ref 150–400)
RBC: 4.34 MIL/uL (ref 3.87–5.11)
RDW: 13.1 % (ref 11.5–15.5)
WBC: 7.4 10*3/uL (ref 4.0–10.5)
nRBC: 0 % (ref 0.0–0.2)

## 2022-02-17 LAB — POCT URINALYSIS DIP (MANUAL ENTRY)
Glucose, UA: NEGATIVE mg/dL
Leukocytes, UA: NEGATIVE
Nitrite, UA: NEGATIVE
Spec Grav, UA: >=1.03 — AB (ref 1.010–1.025)
Urobilinogen, UA: 0.2 E.U./dL
pH, UA: 5.5 (ref 5.0–8.0)

## 2022-02-17 LAB — COMPREHENSIVE METABOLIC PANEL
ALT: 31 U/L (ref 0–44)
AST: 27 U/L (ref 15–41)
Albumin: 3.9 g/dL (ref 3.5–5.0)
Alkaline Phosphatase: 54 U/L (ref 38–126)
Anion gap: 10 (ref 5–15)
BUN: 15 mg/dL (ref 8–23)
CO2: 27 mmol/L (ref 22–32)
Calcium: 9.3 mg/dL (ref 8.9–10.3)
Chloride: 101 mmol/L (ref 98–111)
Creatinine, Ser: 0.79 mg/dL (ref 0.44–1.00)
GFR, Estimated: >60 mL/min (ref >60)
Glucose, Bld: 123 mg/dL — ABNORMAL HIGH (ref 70–99)
Potassium: 4 mmol/L (ref 3.5–5.1)
Sodium: 138 mmol/L (ref 135–145)
Total Bilirubin: 0.7 mg/dL (ref 0.3–1.2)
Total Protein: 6.9 g/dL (ref 6.5–8.1)

## 2022-02-17 LAB — LIPASE, BLOOD: Lipase: 26 U/L (ref 11–51)

## 2022-02-17 MED ORDER — CIPROFLOXACIN IN D5W 400 MG/200ML IV SOLN
400.0000 mg | Freq: Once | INTRAVENOUS | Status: AC
  Administered 2022-02-17: 400 mg via INTRAVENOUS
  Filled 2022-02-17: qty 200

## 2022-02-17 MED ORDER — ONDANSETRON HCL 4 MG/2ML IJ SOLN
4.0000 mg | Freq: Once | INTRAMUSCULAR | Status: DC
  Filled 2022-02-17: qty 2

## 2022-02-17 MED ORDER — METRONIDAZOLE 500 MG PO TABS: 500.0000 mg | ORAL_TABLET | Freq: Three times a day (TID) | ORAL | 0 refills | Status: DC

## 2022-02-17 MED ORDER — CIPROFLOXACIN HCL 500 MG PO TABS: 500.0000 mg | ORAL_TABLET | Freq: Two times a day (BID) | ORAL | 0 refills | Status: DC

## 2022-02-17 MED ORDER — SODIUM CHLORIDE 0.9 % IV SOLN
1000.0000 mL | Freq: Once | INTRAVENOUS | Status: AC
  Administered 2022-02-17: 1000 mL via INTRAVENOUS

## 2022-02-17 MED ORDER — METRONIDAZOLE 500 MG/100ML IV SOLN
500.0000 mg | Freq: Once | INTRAVENOUS | Status: AC
  Administered 2022-02-17: 500 mg via INTRAVENOUS
  Filled 2022-02-17: qty 100

## 2022-02-17 MED ORDER — IOHEXOL 300 MG/ML  SOLN
100.0000 mL | Freq: Once | INTRAMUSCULAR | Status: AC | PRN
  Administered 2022-02-17: 100 mL via INTRAVENOUS

## 2022-02-17 NOTE — Discharge Instructions (Signed)
Take your entire course of the antibiotics prescribed.  Do not drink alcohol while on Flagyl.  Maintain a clear liquid diet for the next 24 to 48 hours, if your symptoms are improving you can advance your diet as tolerated.  Remember to avoid constipation, you may need to add Colace if needed to avoid straining. ?

## 2022-02-17 NOTE — Discharge Instructions (Signed)
Patient is in need of a higher level of care than we can provide in the urgent care setting including consideration for a CT scan to rule out diverticulitis, acute abdomen.  ?

## 2022-02-17 NOTE — ED Notes (Signed)
Patient verbalized understanding of discharge paperwork.  ?

## 2022-02-17 NOTE — ED Triage Notes (Signed)
Patient arrived from [facility] with complaints of left lower abdominal pain that started 3/17. Denies N/V/D.  ?

## 2022-02-17 NOTE — ED Provider Notes (Signed)
?[place] ? ? ?MRN: [id number] DOB: [birth year] ? ?Subjective:  ? ?Kathleen Wise is a 66 y.o. female presenting for 3-day history of acute onset persistent and worsening severe left lower abdominal pain.  Symptoms started over the right side but has since progressed to the left lower side.  Symptoms are now constant.  No fever, nausea, vomiting, diarrhea, constipation, dysuria, hematuria.  Patient does admit that she has not been hydrating as well due to her pain.  No history of kidney stones or kidney issues.  She has had a colonoscopy, this was done about 2 years ago.  States that she is on a 5-year follow-up.  Denies having history of any GI disorders. ? ?No current facility-administered medications for this encounter. ? ?Current Outpatient Medications:  ?  fluticasone (FLONASE) 50 MCG/ACT nasal spray, Place into both nostrils daily., Disp: , Rfl:  ?  azithromycin (ZITHROMAX) 250 MG tablet, Take 2 tablets by mouth on day 1 then 1 tablet daily on days 2 through 5, Disp: 6 tablet, Rfl: 0 ?  benzonatate (TESSALON) 100 MG capsule, Take1 capsule by mouth three times daily as needed for cough., Disp: 42 capsule, Rfl: 0 ?  betamethasone dipropionate 0.05 % lotion, APPLY TOPICALLY ONTO THE SKIN FOR 6 WEEKS, Disp: 60 mL, Rfl: 1 ?  Biotin 10 MG CAPS, Take 1 capsule by mouth daily., Disp: , Rfl:  ?  buPROPion (WELLBUTRIN XL) 300 MG 24 hr tablet, TAKE 1 TABLET BY MOUTH EVERY MORNING, Disp: 90 tablet, Rfl: 3 ?  buPROPion (WELLBUTRIN XL) 300 MG 24 hr tablet, TAKE 1 TABLET BY MOUTH EVERY MORNING, Disp: 90 tablet, Rfl: 3 ?  cetirizine (ZYRTEC) 10 MG tablet, Take 10 mg by mouth daily as needed., Disp: , Rfl:  ?  cholecalciferol (VITAMIN D3) 25 MCG (1000 UT) tablet, Take 1,000 Units by mouth daily., Disp: , Rfl:  ?  clindamycin (CLINDAGEL) 1 % gel, Apply 1 application topically 2 (two) times daily. To the face, Disp: , Rfl: 9 ?  Coenzyme Q10 (COQ10) 200 MG CAPS, Take 1 tablet by mouth daily at 6 (six) AM., Disp:  , Rfl:  ?  COVID-19 mRNA Vac-TriS, Pfizer, (PFIZER-BIONT COVID-19 VAC-TRIS) SUSP injection, Inject into the muscle., Disp: 0.3 mL, Rfl: 0 ?  estradiol (VIVELLE-DOT) 0.1 MG/24HR patch, Place 1 patch onto the skin 2 (two) times a week. Switch out on Every Sunday and Thursday,, Disp: , Rfl:  ?  estradiol (VIVELLE-DOT) 0.1 MG/24HR patch, APPLY 1 PATCH 2 TIMES A WEEK., Disp: 24 patch, Rfl: 1 ?  hydrocortisone (ANUSOL-HC) 25 MG suppository, Unwrap and insert 1 suppository rectally once a day for 5 to 7 days as directed, Disp: 7 suppository, Rfl: 1 ?  minocycline (MINOCIN) 100 MG capsule, Take 1 capsule by mouth once a day as needed for flares.  Take with food and a full glass of water, Disp: 30 capsule, Rfl: 5 ?  pantoprazole (PROTONIX) 40 MG tablet, TAKE 1 TABLET BY MOUTH 3 TIMES WEEKLY (Patient taking differently: Take 20 mg by mouth daily.), Disp: 12 tablet, Rfl: 10 ?  predniSONE (STERAPRED UNI-PAK 21 TAB) 10 MG (21) TBPK tablet, Take as directed on package for 6 days, Disp: 21 tablet, Rfl: 0 ?  rosuvastatin (CRESTOR) 20 MG tablet, TAKE 1 TABLET BY MOUTH ONCE A DAY, Disp: 90 tablet, Rfl: 3 ?  traZODone (DESYREL) 50 MG tablet, TAKE 1-2 TABLETS BY MOUTH ONCE DAILY AT BEDTIME AS NEEDED FOR SLEEP, Disp: 180 tablet, Rfl: 0 ?  valsartan-hydrochlorothiazide (DIOVAN-HCT)  80-12.5 MG tablet, Take 1 tablet by mouth daily., Disp: , Rfl:  ?  valsartan-hydrochlorothiazide (DIOVAN-HCT) 80-12.5 MG tablet, Take 1 tablet by mouth daily., Disp: 90 tablet, Rfl: 3  ? ?Allergies  ?Allergen Reactions  ? Aleve [Naproxen Sodium] Itching  ?  Can take naproxen,  Can take advil--only problem is with aleve brand  ? Bactrim [Sulfamethoxazole-Trimethoprim] Hives  ? Codeine Other (See Comments)  ?  "Speed up on the inside and slows down on the outside"  ? Morphine And Related Itching  ? Penicillins Itching  ? Sulfa Antibiotics Hives  ? ? ?Past Medical History:  ?Diagnosis Date  ? Anxiety   ? DUB (dysfunctional uterine bleeding)   ? GERD  (gastroesophageal reflux disease)   ? Pelvic pain in female   ? Uterus, adenomyosis   ? Wears contact lenses   ?  ? ?Past Surgical History:  ?Procedure Laterality Date  ? COLONOSCOPY    ? HYSTEROSCOPY WITH D & C  09/24/2012  ? Procedure: DILATATION AND CURETTAGE /HYSTEROSCOPY;  Surgeon: Maisie Fus, MD;  Location: [facility];  Service: Gynecology;  Laterality: N/A;  ? LAPAROSCOPIC ASSISTED VAGINAL HYSTERECTOMY N/A 11/01/2014  ? Procedure: LAPAROSCOPIC ASSISTED VAGINAL HYSTERECTOMY;  Surgeon: Maisie Fus, MD;  Location: [facility];  Service: Gynecology;  Laterality: N/A;  ? SALPINGOOPHORECTOMY Bilateral 11/01/2014  ? Procedure: SALPINGO OOPHORECTOMY;  Surgeon: Maisie Fus, MD;  Location: [facility];  Service: Gynecology;  Laterality: Bilateral;  ? ? ?Family History  ?Problem Relation Age of Onset  ? Hypertension Mother   ? Hyperlipidemia Mother   ? Arrhythmia Mother   ? Thyroid disease Mother   ? Seizures Mother   ? Diabetes Father   ? Hypertension Father   ? Heart disease Father   ? Valvular heart disease Father   ?     S/p TAVR  ? Diabetes Sister   ? Hyperlipidemia Sister   ? ? ?Social History  ? ?Tobacco Use  ? Smoking status: Never  ? Smokeless tobacco: Never  ?Vaping Use  ? Vaping Use: Never used  ?Substance Use Topics  ? Alcohol use: Yes  ?  Alcohol/week: 14.0 standard drinks  ?  Types: 14 Glasses of wine per week  ?  Comment: 2 wine daily  ? Drug use: No  ? ? ?ROS ? ? ?Objective:  ? ?Vitals: ?BP (!) 152/74 (BP Location: Right Arm)   Pulse 77   Temp 98.9 ?F (37.2 ?C) (Oral)   Resp 18   SpO2 96%  ? ?Physical Exam ?Constitutional:   ?   General: She is not in acute distress. ?   Appearance: Normal appearance. She is well-developed. She is not ill-appearing, toxic-appearing or diaphoretic.  ?HENT:  ?   Head: Normocephalic and atraumatic.  ?   Nose: Nose normal.  ?   Mouth/Throat:  ?   Mouth: Mucous membranes are moist.  ?   Pharynx: Oropharynx is clear.  ?Eyes:  ?   General: No  scleral icterus.    ?   Right eye: No discharge.     ?   Left eye: No discharge.  ?   Extraocular Movements: Extraocular movements intact.  ?   Conjunctiva/sclera: Conjunctivae normal.  ?Cardiovascular:  ?   Rate and Rhythm: Normal rate.  ?Pulmonary:  ?   Effort: Pulmonary effort is normal.  ?Abdominal:  ?   General: Bowel sounds are normal. There is distension.  ?   Palpations: Abdomen is soft. There is  no mass.  ?   Tenderness: There is abdominal tenderness in the left lower quadrant. There is guarding. There is no right CVA tenderness, left CVA tenderness or rebound.  ?Skin: ?   General: Skin is warm and dry.  ?Neurological:  ?   General: No focal deficit present.  ?   Mental Status: She is alert and oriented to person, place, and time.  ?Psychiatric:     ?   Mood and Affect: Mood normal.     ?   Behavior: Behavior normal.     ?   Thought Content: Thought content normal.     ?   Judgment: Judgment normal.  ? ? ?Results for orders placed or performed during the hospital encounter of 02/17/22 (from the past 24 hour(s))  ?POCT urinalysis dipstick     Status: Abnormal  ? Collection Time: 02/17/22  9:35 AM  ?Result Value Ref Range  ? Color, UA yellow yellow  ? Clarity, UA clear clear  ? Glucose, UA negative negative mg/dL  ? Bilirubin, UA small (A) negative  ? Ketones, POC UA trace (5) (A) negative mg/dL  ? Spec Grav, UA >=1.030 (A) 1.010 - 1.025  ? Blood, UA moderate (A) negative  ? pH, UA 5.5 5.0 - 8.0  ? Protein Ur, POC trace (A) negative mg/dL  ? Urobilinogen, UA 0.2 0.2 or 1.0 E.U./dL  ? Nitrite, UA Negative Negative  ? Leukocytes, UA Negative Negative  ? ? ?Assessment and Plan :  ? ?PDMP not reviewed this encounter. ? ?1. Abdominal pain, left lower quadrant   ?2. Acute abdomen   ? ?Patient is in need of a higher level of care than we can provide in the urgent care setting.  She has a physical exam and HPI concerning for an acute abdomen, acute diverticulitis versus other acute emergent problem such as  obstructive uropathy, renal colic, etc.  I discussed this with patient and her husband, they are agreeable to presenting to the emergency room now by personal vehicle. ?  ?Jaynee Eagles, PA-C ?02/17/22 1601 ? ?

## 2022-02-17 NOTE — ED Provider Notes (Signed)
?[place] ?Provider Note ? ? ?CSN: [id number] ?Arrival date & time: 02/17/22  0955 ? ?  ? ?History ? ?Chief Complaint  ?Patient presents with  ? Abdominal Pain  ? ? ?Kathleen Wise is a 66 y.o. female. ? ?The history is provided by the patient and the spouse.  ?Abdominal Pain ?Pain location:  LLQ (initial had sx in RLQ, but has moved to the LLQ) ?Pain quality: aching and cramping   ?Pain radiates to:  Does not radiate ?Pain severity:  Moderate ?Onset quality:  Gradual ?Duration:  3 days ?Timing:  Constant ?Progression:  Unchanged ?Chronicity:  New ?Context: not diet changes, not eating, not previous surgeries, not recent illness, not sick contacts and not suspicious food intake   ?Relieved by:  Not moving ?Worsened by:  Palpation and movement ?Ineffective treatments:  None tried ?Associated symptoms: no anorexia, no belching, no chest pain, no chills, no constipation, no diarrhea, no dysuria, no fever, no nausea, no shortness of breath, no vaginal discharge and no vomiting   ? ?  ? ?Home Medications ?Prior to Admission medications   ?Medication Sig Start Date End Date Taking? Authorizing Provider  ?ciprofloxacin (CIPRO) 500 MG tablet Take 1 tablet (500 mg total) by mouth every 12 (twelve) hours. 02/17/22  Yes Demetrion Wesby, Almyra Free, PA-C  ?metroNIDAZOLE (FLAGYL) 500 MG tablet Take 1 tablet (500 mg total) by mouth 3 (three) times daily. 02/17/22  Yes Aerial Dilley, Almyra Free, PA-C  ?benzonatate (TESSALON) 100 MG capsule Take1 capsule by mouth three times daily as needed for cough. 12/12/21     ?betamethasone dipropionate 0.05 % lotion APPLY TOPICALLY ONTO THE SKIN FOR 6 WEEKS 09/26/21 09/26/22    ?Biotin 10 MG CAPS Take 1 capsule by mouth daily.    [provider]  ?buPROPion (WELLBUTRIN XL) 300 MG 24 hr tablet TAKE 1 TABLET BY MOUTH EVERY MORNING 03/21/20 11/05/21  Josetta Huddle, MD  ?buPROPion (WELLBUTRIN XL) 300 MG 24 hr tablet TAKE 1 TABLET BY MOUTH EVERY MORNING 07/09/21     ?cetirizine (ZYRTEC) 10 MG tablet Take  10 mg by mouth daily as needed.    [provider]  ?cholecalciferol (VITAMIN D3) 25 MCG (1000 UT) tablet Take 1,000 Units by mouth daily.    [provider]  ?Coenzyme Q10 (COQ10) 200 MG CAPS Take 1 tablet by mouth daily at 6 (six) AM.    [provider]  ?COVID-19 mRNA Vac-TriS, Pfizer, (PFIZER-BIONT COVID-19 VAC-TRIS) SUSP injection Inject into the muscle. 04/23/21   Carlyle Basques, MD  ?estradiol (VIVELLE-DOT) 0.1 MG/24HR patch Place 1 patch onto the skin 2 (two) times a week. Switch out on Every Sunday and Thursday,    [provider]  ?estradiol (VIVELLE-DOT) 0.1 MG/24HR patch APPLY 1 PATCH 2 TIMES A WEEK. 08/13/21     ?fluticasone (FLONASE) 50 MCG/ACT nasal spray Place into both nostrils daily.    [provider]  ?hydrocortisone (ANUSOL-HC) 25 MG suppository Unwrap and insert 1 suppository rectally once a day for 5 to 7 days as directed 09/14/21     ?minocycline (MINOCIN) 100 MG capsule Take 1 capsule by mouth once a day as needed for flares.  Take with food and a full glass of water 01/28/22     ?pantoprazole (PROTONIX) 40 MG tablet TAKE 1 TABLET BY MOUTH 3 TIMES WEEKLY ?Patient taking differently: Take 20 mg by mouth daily. 04/03/21     ?predniSONE (STERAPRED UNI-PAK 21 TAB) 10 MG (21) TBPK tablet Take as directed on package for 6 days  12/25/21     ?rosuvastatin (CRESTOR) 20 MG tablet TAKE 1 TABLET BY MOUTH ONCE A DAY 02/15/22 02/15/23  Tolia, Sunit, DO  ?traZODone (DESYREL) 50 MG tablet TAKE 1-2 TABLETS BY MOUTH ONCE DAILY AT BEDTIME AS NEEDED FOR SLEEP 01/17/21 01/17/22  Josetta Huddle, MD  ?valsartan-hydrochlorothiazide (DIOVAN-HCT) 80-12.5 MG tablet Take 1 tablet by mouth daily.    [provider]  ?valsartan-hydrochlorothiazide (DIOVAN-HCT) 80-12.5 MG tablet Take 1 tablet by mouth daily. 10/04/21     ?   ? ?Allergies    ?Aleve [naproxen sodium], Bactrim [sulfamethoxazole-trimethoprim], Codeine, Morphine and related, Penicillins, and Sulfa antibiotics    ? ?Review of Systems   ?Review of Systems  ?Constitutional:  Negative for appetite change, chills and fever.  ?     Has been able to eat and drink without worsening sx, but has had less fluid intake since ytd.  ?HENT: Negative.    ?Respiratory:  Negative for shortness of breath.   ?Cardiovascular:  Negative for chest pain.  ?Gastrointestinal:  Positive for abdominal pain. Negative for anorexia, constipation, diarrhea, nausea and vomiting.  ?Genitourinary:  Negative for dysuria and vaginal discharge.  ?All other systems reviewed and are negative. ? ?Physical Exam ?Updated Vital Signs ?BP (!) 142/80   Pulse 61   Temp 97.9 ?F (36.6 ?C) (Oral)   Resp 16   Ht '5\' 8"'$  (1.727 m)   Wt 99.3 kg   SpO2 100%   BMI 33.30 kg/m?  ?Physical Exam ?Vitals and nursing note reviewed.  ?Constitutional:   ?   Appearance: She is well-developed.  ?HENT:  ?   Head: Normocephalic and atraumatic.  ?Eyes:  ?   Conjunctiva/sclera: Conjunctivae normal.  ?Cardiovascular:  ?   Rate and Rhythm: Normal rate and regular rhythm.  ?   Heart sounds: Normal heart sounds.  ?Pulmonary:  ?   Effort: Pulmonary effort is normal.  ?   Breath sounds: Normal breath sounds. No wheezing.  ?Abdominal:  ?   General: Bowel sounds are normal.  ?   Palpations: Abdomen is soft.  ?   Tenderness: There is abdominal tenderness in the left lower quadrant. There is guarding. There is no rebound.  ?   Hernia: No hernia is present.  ?Musculoskeletal:     ?   General: Normal range of motion.  ?   Cervical back: Normal range of motion.  ?Skin: ?   General: Skin is warm and dry.  ?Neurological:  ?   Mental Status: She is alert.  ? ? ?ED Results / Procedures / Treatments   ?Labs ?(all labs ordered are listed, but only abnormal results are displayed) ?Labs Reviewed  ?COMPREHENSIVE METABOLIC PANEL - Abnormal; Notable for the following components:  ?    Result Value  ? Glucose, Bld 123 (*)   ? All other components within normal limits  ?CBC WITH DIFFERENTIAL/PLATELET  ?LIPASE,  BLOOD  ?URINALYSIS, ROUTINE W REFLEX MICROSCOPIC  ? ? ?EKG ?None ? ?Radiology ?CT Abdomen Pelvis W Contrast ? ?Result Date: 02/17/2022 ?CLINICAL DATA:  Left lower quadrant abdominal pain EXAM: CT ABDOMEN AND PELVIS WITH CONTRAST TECHNIQUE: Multidetector CT imaging of the abdomen and pelvis was performed using the standard protocol following bolus administration of intravenous contrast. RADIATION DOSE REDUCTION: This exam was performed according to the departmental dose-optimization program which includes automated exposure control, adjustment of the mA and/or kV according to patient size and/or use of iterative reconstruction technique. CONTRAST:  12m OMNIPAQUE IOHEXOL 300 MG/ML  SOLN COMPARISON:  None. FINDINGS: Lower  chest: No acute abnormality. Hepatobiliary: No focal liver abnormality is seen. No gallstones, gallbladder wall thickening, or biliary dilatation. Pancreas: Unremarkable. No pancreatic ductal dilatation or surrounding inflammatory changes. Spleen: Normal in size without focal abnormality. Adrenals/Urinary Tract: Normal adrenal glands. No kidney mass or hydronephrosis identified bilaterally. Urinary bladder is unremarkable. Stomach/Bowel: The stomach appears normal. The appendix is visualized and is normal. Sigmoid diverticulosis. Inflamed diverticula is identified arising off the proximal sigmoid colon, image 72/2. There is surrounding soft tissue stranding and wall thickening of the proximal sigmoid colon. No signs of free perforation or abscess formation. Vascular/Lymphatic: No significant vascular findings are present. No enlarged abdominal or pelvic lymph nodes. Reproductive: Status post hysterectomy. No adnexal masses. Other: No free fluid or fluid collections. Musculoskeletal: No acute or significant osseous findings. IMPRESSION: Acute sigmoid diverticulitis. No signs of free perforation or abscess formation. Electronically Signed   By: Kerby Moors M.D.   On: 02/17/2022 11:53    ? ?Procedures ?Procedures  ? ? ?Medications Ordered in ED ?Medications  ?ciprofloxacin (CIPRO) IVPB 400 mg (400 mg Intravenous New Bag/Given 02/17/22 1313)  ?metroNIDAZOLE (FLAGYL) IVPB 500 mg (has no administration in time rang

## 2022-02-17 NOTE — ED Notes (Signed)
Patient transported to CT 

## 2022-02-17 NOTE — ED Triage Notes (Signed)
Abd pain since Friday.  States pain started on right side and has since moved over to left side.  Last BM was at 330 this morning ?

## 2022-02-18 ENCOUNTER — Other Ambulatory Visit (HOSPITAL_COMMUNITY): Payer: Self-pay

## 2022-02-19 ENCOUNTER — Other Ambulatory Visit (HOSPITAL_COMMUNITY): Payer: Self-pay

## 2022-02-19 MED ORDER — ESTRADIOL 0.1 MG/24HR TD PTTW
MEDICATED_PATCH | TRANSDERMAL | 0 refills | Status: DC
  Filled 2022-02-19: qty 8, 28d supply, fill #0

## 2022-02-27 ENCOUNTER — Other Ambulatory Visit (HOSPITAL_COMMUNITY): Payer: Self-pay

## 2022-02-27 DIAGNOSIS — D225 Melanocytic nevi of trunk: Secondary | ICD-10-CM | POA: Diagnosis not present

## 2022-02-27 DIAGNOSIS — L281 Prurigo nodularis: Secondary | ICD-10-CM | POA: Diagnosis not present

## 2022-02-27 DIAGNOSIS — L819 Disorder of pigmentation, unspecified: Secondary | ICD-10-CM | POA: Diagnosis not present

## 2022-02-27 DIAGNOSIS — L718 Other rosacea: Secondary | ICD-10-CM | POA: Diagnosis not present

## 2022-02-27 DIAGNOSIS — L821 Other seborrheic keratosis: Secondary | ICD-10-CM | POA: Diagnosis not present

## 2022-02-27 DIAGNOSIS — I788 Other diseases of capillaries: Secondary | ICD-10-CM | POA: Diagnosis not present

## 2022-02-27 MED ORDER — DOXYCYCLINE MONOHYDRATE 100 MG PO TABS
100.0000 mg | ORAL_TABLET | Freq: Every day | ORAL | 12 refills | Status: AC
  Filled 2022-02-27: qty 30, 30d supply, fill #0
  Filled 2022-03-27: qty 30, 30d supply, fill #1
  Filled 2022-04-23: qty 30, 30d supply, fill #2
  Filled 2022-05-25: qty 30, 30d supply, fill #3
  Filled 2022-06-20: qty 30, 30d supply, fill #4
  Filled 2022-08-07: qty 30, 30d supply, fill #5
  Filled 2022-09-11: qty 30, 30d supply, fill #6
  Filled 2022-10-11: qty 30, 30d supply, fill #7
  Filled 2022-11-12: qty 30, 30d supply, fill #8

## 2022-03-12 ENCOUNTER — Other Ambulatory Visit (HOSPITAL_COMMUNITY): Payer: Self-pay

## 2022-03-12 DIAGNOSIS — Z1231 Encounter for screening mammogram for malignant neoplasm of breast: Secondary | ICD-10-CM | POA: Diagnosis not present

## 2022-03-12 DIAGNOSIS — Z6834 Body mass index (BMI) 34.0-34.9, adult: Secondary | ICD-10-CM | POA: Diagnosis not present

## 2022-03-12 DIAGNOSIS — Z01419 Encounter for gynecological examination (general) (routine) without abnormal findings: Secondary | ICD-10-CM | POA: Diagnosis not present

## 2022-03-12 MED ORDER — ESTRADIOL 0.1 MG/24HR TD PTTW
MEDICATED_PATCH | TRANSDERMAL | 3 refills | Status: DC
  Filled 2022-03-12: qty 24, 84d supply, fill #0
  Filled 2022-06-17: qty 24, 84d supply, fill #1
  Filled 2022-09-18: qty 24, 84d supply, fill #2

## 2022-03-13 ENCOUNTER — Other Ambulatory Visit (HOSPITAL_COMMUNITY): Payer: Self-pay

## 2022-03-14 ENCOUNTER — Other Ambulatory Visit (HOSPITAL_COMMUNITY): Payer: Self-pay

## 2022-03-14 DIAGNOSIS — K5730 Diverticulosis of large intestine without perforation or abscess without bleeding: Secondary | ICD-10-CM | POA: Diagnosis not present

## 2022-03-14 DIAGNOSIS — R1013 Epigastric pain: Secondary | ICD-10-CM | POA: Diagnosis not present

## 2022-03-14 DIAGNOSIS — K921 Melena: Secondary | ICD-10-CM | POA: Diagnosis not present

## 2022-03-14 DIAGNOSIS — K625 Hemorrhage of anus and rectum: Secondary | ICD-10-CM | POA: Diagnosis not present

## 2022-03-14 MED ORDER — HYDROCORTISONE ACETATE 25 MG RE SUPP
RECTAL | 0 refills | Status: DC
  Filled 2022-03-14: qty 14, 7d supply, fill #0

## 2022-03-21 ENCOUNTER — Other Ambulatory Visit (HOSPITAL_COMMUNITY): Payer: Self-pay

## 2022-03-27 ENCOUNTER — Other Ambulatory Visit (HOSPITAL_COMMUNITY): Payer: Self-pay

## 2022-03-27 MED ORDER — PANTOPRAZOLE SODIUM 40 MG PO TBEC
DELAYED_RELEASE_TABLET | ORAL | 0 refills | Status: DC
  Filled 2022-03-27: qty 90, 90d supply, fill #0

## 2022-04-11 ENCOUNTER — Other Ambulatory Visit (HOSPITAL_COMMUNITY): Payer: Self-pay

## 2022-04-11 MED ORDER — AZELAIC ACID 15 % EX GEL
CUTANEOUS | 2 refills | Status: DC
  Filled 2022-04-11: qty 50, 30d supply, fill #0
  Filled 2022-05-27: qty 50, 30d supply, fill #1
  Filled 2022-06-21: qty 50, 30d supply, fill #2

## 2022-04-12 ENCOUNTER — Other Ambulatory Visit (HOSPITAL_COMMUNITY): Payer: Self-pay

## 2022-04-15 ENCOUNTER — Other Ambulatory Visit (HOSPITAL_COMMUNITY): Payer: Self-pay

## 2022-04-15 DIAGNOSIS — K219 Gastro-esophageal reflux disease without esophagitis: Secondary | ICD-10-CM | POA: Diagnosis not present

## 2022-04-15 DIAGNOSIS — Z8719 Personal history of other diseases of the digestive system: Secondary | ICD-10-CM | POA: Diagnosis not present

## 2022-04-15 DIAGNOSIS — R1084 Generalized abdominal pain: Secondary | ICD-10-CM | POA: Diagnosis not present

## 2022-04-15 DIAGNOSIS — R195 Other fecal abnormalities: Secondary | ICD-10-CM | POA: Diagnosis not present

## 2022-04-15 DIAGNOSIS — K625 Hemorrhage of anus and rectum: Secondary | ICD-10-CM | POA: Diagnosis not present

## 2022-04-15 MED ORDER — PANTOPRAZOLE SODIUM 40 MG PO TBEC
DELAYED_RELEASE_TABLET | ORAL | 3 refills | Status: DC
  Filled 2022-04-15: qty 180, 90d supply, fill #0
  Filled 2022-07-12: qty 180, 90d supply, fill #1

## 2022-04-15 MED ORDER — HYDROCORTISONE ACETATE 25 MG RE SUPP
RECTAL | 1 refills | Status: DC
  Filled 2022-04-15: qty 10, 10d supply, fill #0
  Filled 2022-05-31: qty 10, 10d supply, fill #1

## 2022-04-16 ENCOUNTER — Other Ambulatory Visit (HOSPITAL_COMMUNITY): Payer: Self-pay

## 2022-04-17 ENCOUNTER — Other Ambulatory Visit (HOSPITAL_COMMUNITY): Payer: Self-pay

## 2022-04-23 ENCOUNTER — Other Ambulatory Visit (HOSPITAL_COMMUNITY): Payer: Self-pay

## 2022-04-26 ENCOUNTER — Other Ambulatory Visit (HOSPITAL_COMMUNITY): Payer: Self-pay

## 2022-04-26 DIAGNOSIS — M79672 Pain in left foot: Secondary | ICD-10-CM | POA: Diagnosis not present

## 2022-04-26 DIAGNOSIS — M7672 Peroneal tendinitis, left leg: Secondary | ICD-10-CM | POA: Diagnosis not present

## 2022-04-26 DIAGNOSIS — M25572 Pain in left ankle and joints of left foot: Secondary | ICD-10-CM | POA: Diagnosis not present

## 2022-04-26 MED ORDER — DICLOFENAC SODIUM 1 % EX GEL
CUTANEOUS | 1 refills | Status: DC
  Filled 2022-04-26: qty 100, 12d supply, fill #0
  Filled 2022-07-12: qty 100, 12d supply, fill #1

## 2022-04-28 IMAGING — CT CT ABD-PELV W/ CM
2 of 5 series · 16 of 46 positions shown, 18 images · IV contrast (Omnipaque or Isovue)
Comparison: None.

CLINICAL DATA: Left lower quadrant abdominal pain

EXAM:
CT ABDOMEN AND PELVIS WITH CONTRAST
TECHNIQUE: Multidetector CT imaging of the abdomen and pelvis was performed
using the standard protocol following bolus administration of
intravenous contrast.

[Series 2: axial st · axial · 0.98mm/px · z∈[+1488,+1948]mm · 13 of 104 slices shown, 15 images]
[im 6/104  soft-tissue]
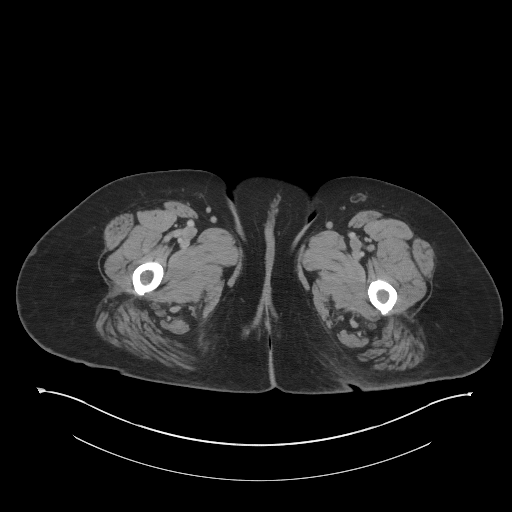
[im 6/104  bone]
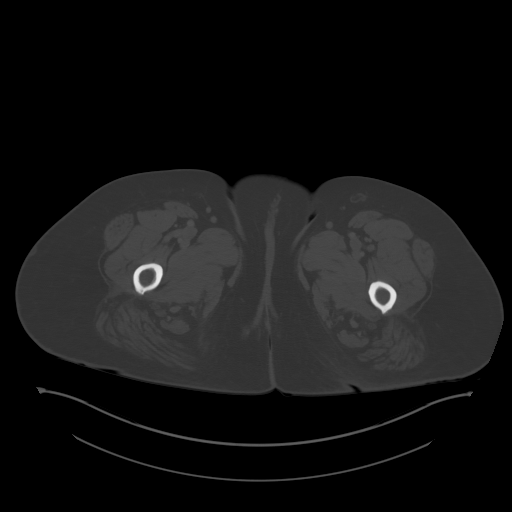
[im 12/104  soft-tissue]
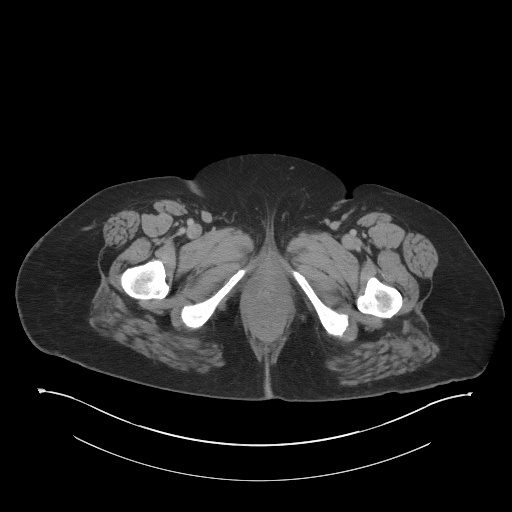
[im 23/104  soft-tissue]
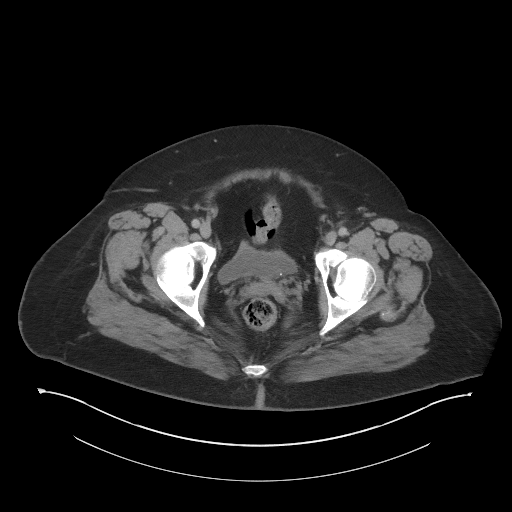
[im 29/104  soft-tissue]
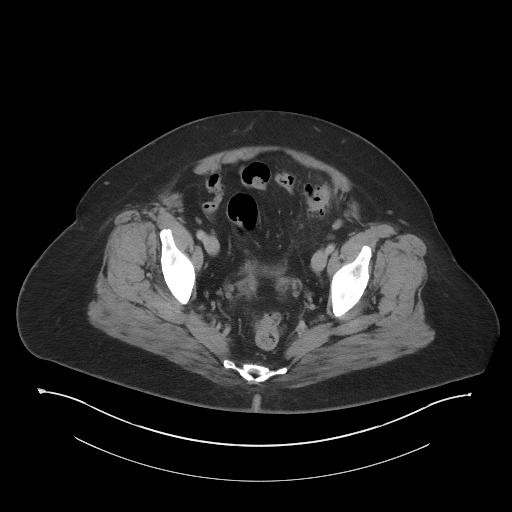
[im 35/104  soft-tissue]
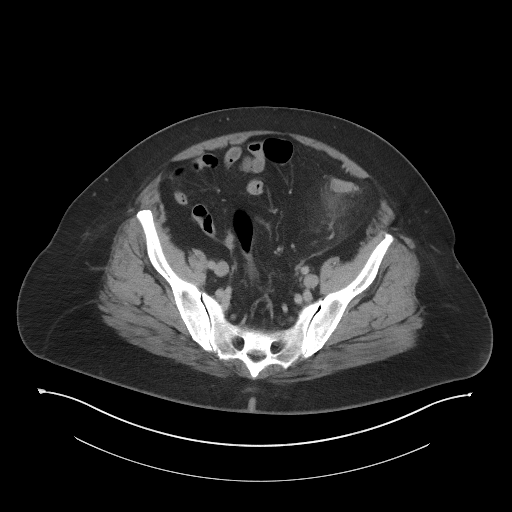
[im 46/104  soft-tissue]
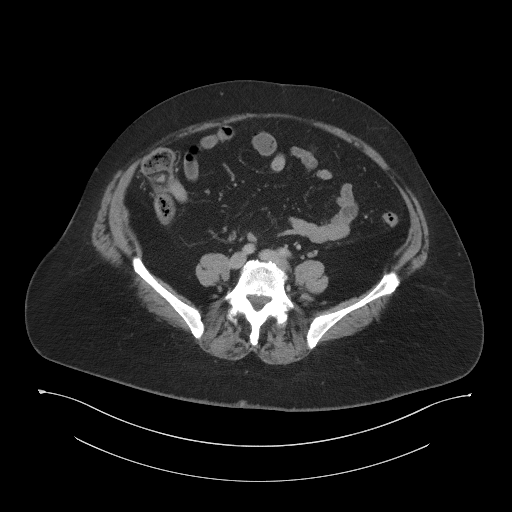
[im 52/104  soft-tissue]
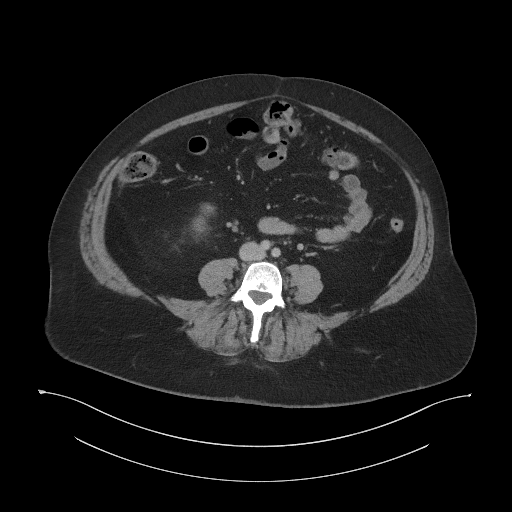
[im 58/104  soft-tissue]
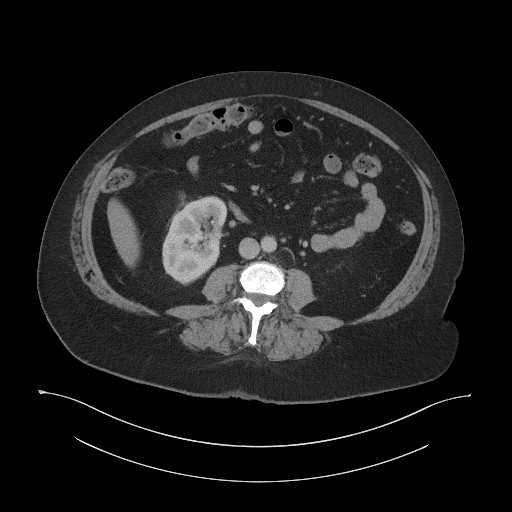
[im 69/104  soft-tissue]
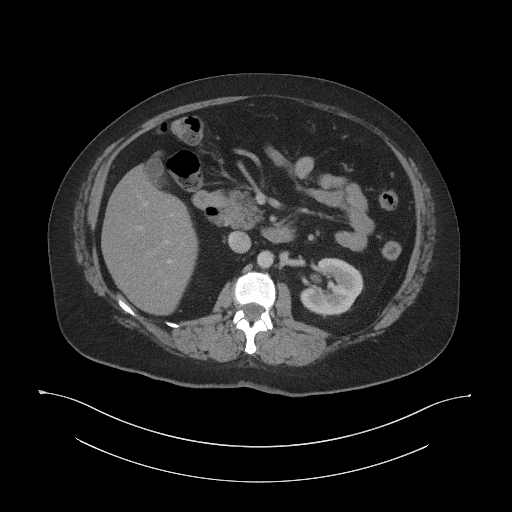
[im 69/104  bone]
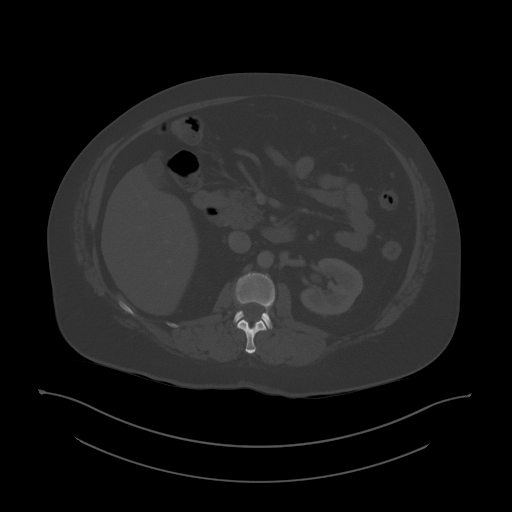
[im 75/104  soft-tissue]
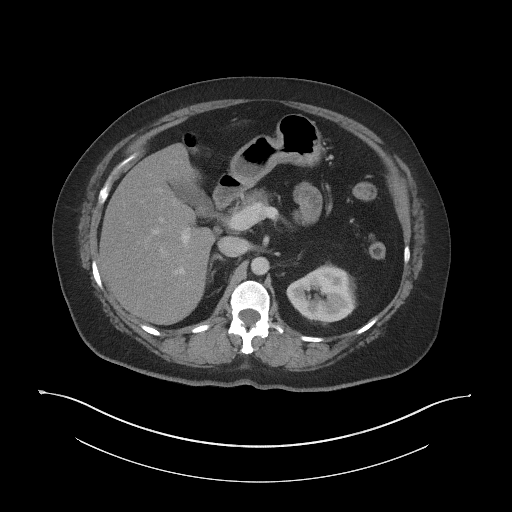
[im 81/104  soft-tissue]
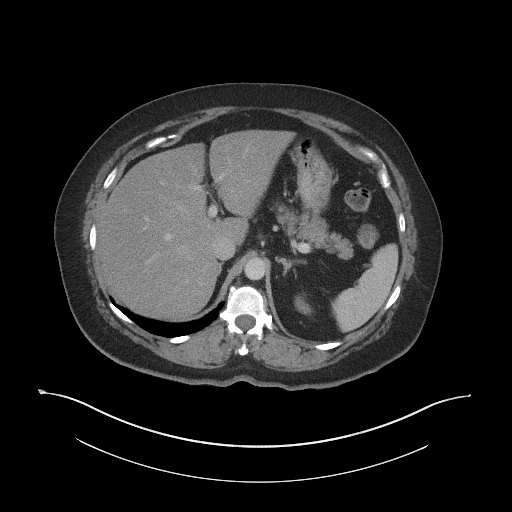
[im 92/104  soft-tissue]
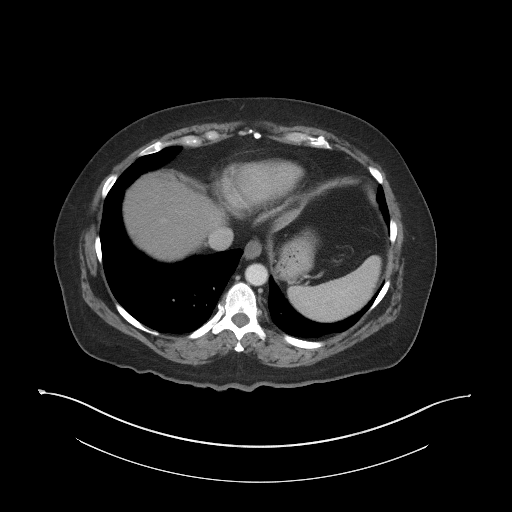
[im 98/104  soft-tissue]
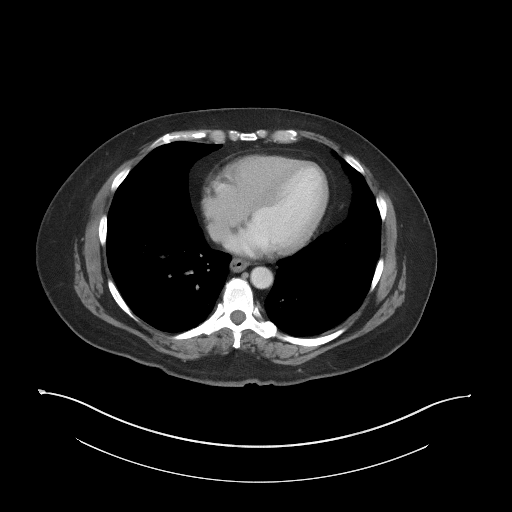

[Series 5: coronal st · coronal · 0.93mm/px · 3 of 124 slices shown]
[im 42/124  soft-tissue]
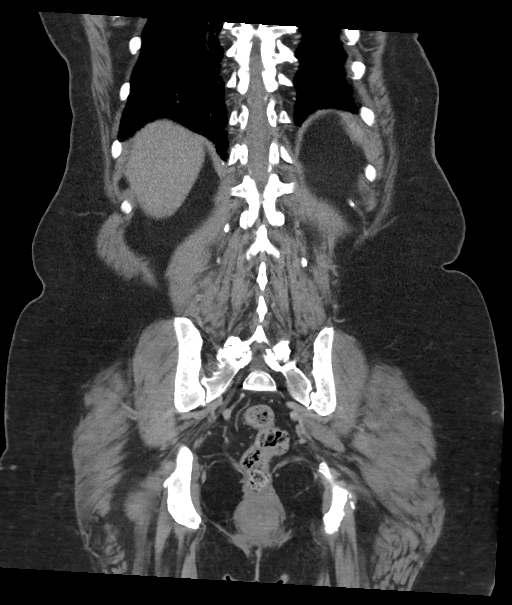
[im 55/124  soft-tissue]
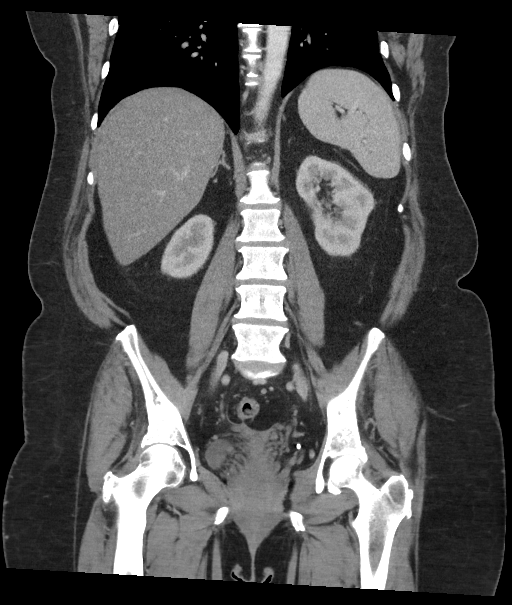
[im 69/124  soft-tissue]
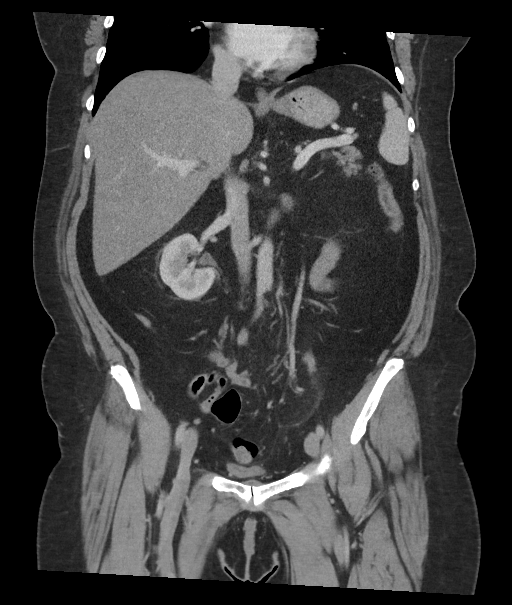

[16 of 46 positions shown; findings below may reference images not displayed]

RADIATION DOSE REDUCTION: This exam was performed according to the
departmental dose-optimization program which includes automated
exposure control, adjustment of the mA and/or kV according to
patient size and/or use of iterative reconstruction technique.

CONTRAST:  100mL OMNIPAQUE IOHEXOL 300 MG/ML  SOLN
FINDINGS: Lower chest: No acute abnormality.

Hepatobiliary: No focal liver abnormality is seen. No gallstones,
gallbladder wall thickening, or biliary dilatation.

Pancreas: Unremarkable. No pancreatic ductal dilatation or
surrounding inflammatory changes.

Spleen: Normal in size without focal abnormality.

Adrenals/Urinary Tract: Normal adrenal glands. No kidney mass or
hydronephrosis identified bilaterally. Urinary bladder is
unremarkable.

Stomach/Bowel: The stomach appears normal. The appendix is
visualized and is normal. Sigmoid diverticulosis. Inflamed
diverticula is identified arising off the proximal sigmoid colon,
image 72/2. There is surrounding soft tissue stranding and wall
thickening of the proximal sigmoid colon. No signs of free
perforation or abscess formation.

Vascular/Lymphatic: No significant vascular findings are present. No
enlarged abdominal or pelvic lymph nodes.

Reproductive: Status post hysterectomy. No adnexal masses.

Other: No free fluid or fluid collections.

Musculoskeletal: No acute or significant osseous findings.
IMPRESSION: Acute sigmoid diverticulitis. No signs of free perforation or
abscess formation.

## 2022-05-06 DIAGNOSIS — E782 Mixed hyperlipidemia: Secondary | ICD-10-CM | POA: Diagnosis not present

## 2022-05-07 ENCOUNTER — Other Ambulatory Visit (HOSPITAL_COMMUNITY): Payer: Self-pay

## 2022-05-07 LAB — LIPID PANEL
Chol/HDL Ratio: 2.8 ratio (ref 0.0–4.4)
Cholesterol, Total: 177 mg/dL (ref 100–199)
HDL: 64 mg/dL (ref >39)
LDL Chol Calc (NIH): 88 mg/dL (ref 0–99)
Triglycerides: 147 mg/dL (ref 0–149)
VLDL Cholesterol Cal: 25 mg/dL (ref 5–40)

## 2022-05-07 MED ORDER — TRAZODONE HCL 50 MG PO TABS
ORAL_TABLET | ORAL | 1 refills | Status: AC
  Filled 2022-05-07: qty 180, 90d supply, fill #0
  Filled 2022-08-07: qty 180, 90d supply, fill #1

## 2022-05-08 ENCOUNTER — Ambulatory Visit: Payer: Self-pay | Admitting: Cardiology

## 2022-05-08 NOTE — Progress Notes (Signed)
Error

## 2022-05-16 ENCOUNTER — Other Ambulatory Visit (HOSPITAL_COMMUNITY): Payer: Self-pay

## 2022-05-16 DIAGNOSIS — G4700 Insomnia, unspecified: Secondary | ICD-10-CM | POA: Diagnosis not present

## 2022-05-16 DIAGNOSIS — R030 Elevated blood-pressure reading, without diagnosis of hypertension: Secondary | ICD-10-CM | POA: Diagnosis not present

## 2022-05-16 DIAGNOSIS — E785 Hyperlipidemia, unspecified: Secondary | ICD-10-CM | POA: Diagnosis not present

## 2022-05-16 DIAGNOSIS — E559 Vitamin D deficiency, unspecified: Secondary | ICD-10-CM | POA: Diagnosis not present

## 2022-05-16 DIAGNOSIS — Z23 Encounter for immunization: Secondary | ICD-10-CM | POA: Diagnosis not present

## 2022-05-16 DIAGNOSIS — R3129 Other microscopic hematuria: Secondary | ICD-10-CM | POA: Diagnosis not present

## 2022-05-16 DIAGNOSIS — E669 Obesity, unspecified: Secondary | ICD-10-CM | POA: Diagnosis not present

## 2022-05-16 DIAGNOSIS — F439 Reaction to severe stress, unspecified: Secondary | ICD-10-CM | POA: Diagnosis not present

## 2022-05-16 DIAGNOSIS — J309 Allergic rhinitis, unspecified: Secondary | ICD-10-CM | POA: Diagnosis not present

## 2022-05-16 DIAGNOSIS — Z1231 Encounter for screening mammogram for malignant neoplasm of breast: Secondary | ICD-10-CM | POA: Diagnosis not present

## 2022-05-16 DIAGNOSIS — Z1211 Encounter for screening for malignant neoplasm of colon: Secondary | ICD-10-CM | POA: Diagnosis not present

## 2022-05-16 DIAGNOSIS — Z0001 Encounter for general adult medical examination with abnormal findings: Secondary | ICD-10-CM | POA: Diagnosis not present

## 2022-05-25 ENCOUNTER — Other Ambulatory Visit (HOSPITAL_COMMUNITY): Payer: Self-pay

## 2022-05-27 ENCOUNTER — Other Ambulatory Visit (HOSPITAL_COMMUNITY): Payer: Self-pay

## 2022-05-28 ENCOUNTER — Other Ambulatory Visit (HOSPITAL_COMMUNITY): Payer: Self-pay

## 2022-05-28 ENCOUNTER — Ambulatory Visit (HOSPITAL_COMMUNITY): Payer: 59 | Attending: Orthopedic Surgery | Admitting: Physical Therapy

## 2022-05-28 ENCOUNTER — Encounter (HOSPITAL_COMMUNITY): Payer: Self-pay | Admitting: Physical Therapy

## 2022-05-28 DIAGNOSIS — M25572 Pain in left ankle and joints of left foot: Secondary | ICD-10-CM | POA: Insufficient documentation

## 2022-05-28 NOTE — Therapy (Signed)
OUTPATIENT PHYSICAL THERAPY LOWER EXTREMITY EVALUATION   Patient Name: Kathleen Wise MRN: [id number] DOB:1956-10-17, 66 y.o., female Today's Date: 05/28/2022   PT End of Session - 05/28/22 1601     Visit Number 1    Number of Visits 8    Date for PT Re-Evaluation 06/25/22    Authorization Type [facility]    PT Start Time 1518    PT Stop Time 1601    PT Time Calculation (min) 43 min    Activity Tolerance Patient tolerated treatment well    Behavior During Therapy [facility] for tasks assessed/performed             Past Medical History:  Diagnosis Date   Anxiety    DUB (dysfunctional uterine bleeding)    GERD (gastroesophageal reflux disease)    Pelvic pain in female    Uterus, adenomyosis    Wears contact lenses    Past Surgical History:  Procedure Laterality Date   COLONOSCOPY     HYSTEROSCOPY WITH D & C  09/24/2012   Procedure: DILATATION AND CURETTAGE /HYSTEROSCOPY;  Surgeon: Freddy Finner, MD;  Location: [facility];  Service: Gynecology;  Laterality: N/A;   LAPAROSCOPIC ASSISTED VAGINAL HYSTERECTOMY N/A 11/01/2014   Procedure: LAPAROSCOPIC ASSISTED VAGINAL HYSTERECTOMY;  Surgeon: Freddy Finner, MD;  Location: [facility];  Service: Gynecology;  Laterality: N/A;   SALPINGOOPHORECTOMY Bilateral 11/01/2014   Procedure: SALPINGO OOPHORECTOMY;  Surgeon: Freddy Finner, MD;  Location: [facility];  Service: Gynecology;  Laterality: Bilateral;   Patient Active Problem List   Diagnosis Date Noted   Elevated coronary artery calcium score 11/05/2021   Family history of early CAD 09/09/2019   Essential hypertension 09/09/2019   Mixed hyperlipidemia 09/09/2019   S/P laparoscopic assisted vaginal hysterectomy (LAVH) 11/01/2014    PCP: Marden Noble MD  REFERRING PROVIDER: Toni Arthurs, MD   REFERRING DIAG: peroneal tendinitis (LT)  THERAPY DIAG:  Pain in left ankle and joints of left foot - Plan: PT plan of care cert/re-cert  Rationale for  Evaluation and Treatment Rehabilitation  ONSET DATE: March 2023  SUBJECTIVE:   SUBJECTIVE STATEMENT: Patient presents to therapy with complaint of LT ankle pain. Patient denies MOI. She notes progressive worsening of LT lateral foot and ankle pain and intermittent swelling. She uses a gel occasionally but denies taking medication for this issue otherwise. She had xrays which were unremarkable except noting bone spurs near heel area.   PERTINENT HISTORY: NA  PAIN:  Are you having pain? Yes: NPRS scale: 7/10 Pain location: Lt lateral foot/ ankle  Pain description: aching, burning  Aggravating factors: WB, walking Relieving factors: non WB   PRECAUTIONS: None  WEIGHT BEARING RESTRICTIONS No  FALLS:  Has patient fallen in last 6 months? Yes. 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[employer])    PLOF: Independent  PATIENT GOALS Be more mobile, improve balance    OBJECTIVE:   DIAGNOSTIC FINDINGS: Xrays   PATIENT SURVEYS:  FOTO 64% function   COGNITION:  Overall cognitive status: Within functional limits for tasks assessed     SENSATION: WFL  EDEMA:  Little to none noted   PALPATION: Minimal TTP about Lt lateral ankle   LOWER EXTREMITY ROM: Bilateral LE AROM WFL except moderate restriction in bilateral ankle DF ROM   Active ROM Right eval Left  eval  Hip flexion    Hip extension    Hip abduction    Hip adduction    Hip internal rotation    Hip external rotation    Knee flexion    Knee extension    Ankle dorsiflexion 3 2  Ankle plantarflexion    Ankle inversion    Ankle eversion     (Blank rows = not tested)  LOWER EXTREMITY MMT:  MMT Right eval Left  eval  Hip flexion 5 5  Hip extension 5 5  Hip abduction 4 4  Hip adduction    Hip internal rotation    Hip external rotation    Knee  flexion    Knee extension 5 5  Ankle dorsiflexion 5 5  Ankle plantarflexion    Ankle inversion 5 5  Ankle eversion 5 5   (Blank rows = not tested)   GAIT: Slight decrease in stride length, no AD    TODAY'S TREATMENT: Eval  Ankle eversion BTB x10 Calf stretch at wall 3 x 30"    PATIENT EDUCATION:  Education details: on eval findings, POC and HEP  Person educated: Patient Education method: Explanation Education comprehension: verbalized understanding   HOME EXERCISE PROGRAM: Access Code: [email or web address] URL: [email or web address] Date: 05/28/2022 Prepared by: Georges Lynch  Exercises - Long Sitting Ankle Eversion with Resistance  - 2-3 x daily - 7 x weekly - 3 sets - 10 reps - Gastroc Stretch on Wall  - 2-3 x daily - 7 x weekly - 1 sets - 3 reps - 30 second hold  ASSESSMENT:  CLINICAL IMPRESSION: Patient is a 66 y.o. female who presents to physical therapy with complaint of LT foot and ankle pain. Patient demonstrates muscle weakness, reduced ROM, and fascial restrictions which are likely contributing to symptoms of pain and are negatively impacting patient ability to perform ADLs and functional mobility tasks. Patient will benefit from skilled physical therapy services to address these deficits to reduce pain and improve level of function with ADLs and functional mobility tasks.    OBJECTIVE IMPAIRMENTS Abnormal gait, decreased balance, decreased ROM, decreased strength, increased fascial restrictions, impaired flexibility, improper body mechanics, and pain.   ACTIVITY LIMITATIONS standing, squatting, stairs, transfers, and locomotion level  PARTICIPATION LIMITATIONS: meal prep, cleaning, laundry, shopping, community activity, occupation, and yard work  PERSONAL FACTORS  none  are also affecting patient's functional outcome.   REHAB POTENTIAL: Good  CLINICAL DECISION MAKING: Stable/uncomplicated  EVALUATION COMPLEXITY: Low   GOALS: SHORT TERM GOALS:  Target date: 06/11/2022  Patient will be independent with initial HEP and self-management strategies to improve functional outcomes Baseline:  Goal status: INITIAL   LONG TERM GOALS: Target date: 06/25/2022  Patient will be independent with advanced HEP and self-management strategies to improve functional outcomes Baseline:  Goal status: INITIAL  2.  Patient will improve FOTO score to predicted value to indicate improvement in functional outcomes Baseline: 64% function  Goal status: INITIAL  3.  Patient will report at least 75% overall improvement in subjective complaint to indicate improvement in ability to perform ADLs. Baseline:  Goal status: INITIAL  4. Patient will have equal to or 5/5 MMT throughout BLE to improve ability to perform functional mobility, stair ambulation and ADLs.  Baseline: See MMT Goal status: INITIAL   PLAN: PT FREQUENCY: 1-2x/week  PT DURATION: 4 weeks  PLANNED INTERVENTIONS: Therapeutic exercises, Therapeutic activity, Neuromuscular re-education, Balance training, Gait training, Patient/Family education, Joint manipulation, Joint mobilization, Stair training, Aquatic Therapy, Dry Needling, Electrical stimulation, Spinal  manipulation, Spinal mobilization, Cryotherapy, Moist heat, scar mobilization, Taping, Traction, Ultrasound, Biofeedback, Ionotophoresis 4mg /ml Dexamethasone, and Manual therapy.   PLAN FOR NEXT SESSION: Progress ankle stability and strength. Hip abduction strength and balance, stairs.   4:05 PM, 05/28/22 Georges Lynch PT DPT  Physical Therapist with [facility]  [phone]

## 2022-05-29 ENCOUNTER — Ambulatory Visit (HOSPITAL_COMMUNITY): Payer: 59

## 2022-05-29 ENCOUNTER — Encounter (HOSPITAL_COMMUNITY): Payer: Self-pay

## 2022-05-29 DIAGNOSIS — M25572 Pain in left ankle and joints of left foot: Secondary | ICD-10-CM

## 2022-05-29 NOTE — Therapy (Signed)
OUTPATIENT PHYSICAL THERAPY LOWER EXTREMITY EVALUATION   Patient Name: Kathleen Wise MRN: [id number] DOB:[birth year], 66 y.o., female Today's Date: 05/29/2022   PT End of Session - 05/29/22 0915     Visit Number 2    Number of Visits 8    Date for PT Re-Evaluation 06/25/22    Authorization Type [facility]    PT Start Time 7619    PT Stop Time 0958    PT Time Calculation (min) 42 min    Activity Tolerance Patient tolerated treatment well    Behavior During Therapy [facility] for tasks assessed/performed             Past Medical History:  Diagnosis Date   Anxiety    DUB (dysfunctional uterine bleeding)    GERD (gastroesophageal reflux disease)    Pelvic pain in female    Uterus, adenomyosis    Wears contact lenses    Past Surgical History:  Procedure Laterality Date   COLONOSCOPY     HYSTEROSCOPY WITH D & C  09/24/2012   Procedure: DILATATION AND CURETTAGE /HYSTEROSCOPY;  Surgeon: Maisie Fus, MD;  Location: [facility];  Service: Gynecology;  Laterality: N/A;   LAPAROSCOPIC ASSISTED VAGINAL HYSTERECTOMY N/A 11/01/2014   Procedure: LAPAROSCOPIC ASSISTED VAGINAL HYSTERECTOMY;  Surgeon: Maisie Fus, MD;  Location: [facility];  Service: Gynecology;  Laterality: N/A;   SALPINGOOPHORECTOMY Bilateral 11/01/2014   Procedure: SALPINGO OOPHORECTOMY;  Surgeon: Maisie Fus, MD;  Location: [facility];  Service: Gynecology;  Laterality: Bilateral;   Patient Active Problem List   Diagnosis Date Noted   Elevated coronary artery calcium score 11/05/2021   Family history of early CAD 09/09/2019   Essential hypertension 09/09/2019   Mixed hyperlipidemia 09/09/2019   S/P laparoscopic assisted vaginal hysterectomy (LAVH) 11/01/2014    PCP: Josetta Huddle MD  REFERRING PROVIDER: Wylene Simmer, MD   REFERRING DIAG: peroneal tendinitis (LT)  THERAPY DIAG:  Pain in left ankle and joints of left foot  Rationale for Evaluation and Treatment  Rehabilitation  ONSET DATE: March 2023  SUBJECTIVE:   SUBJECTIVE STATEMENT: 05/29/22:  Pt stated she had eval yesterday and hasn't began exercise yet.  Reports no pain currently.  Stated pain has gone from lateral ankle to more lateral foot in the last month.  Eval 05/28/22 Subjective:Patient presents to therapy with complaint of LT ankle pain. Patient denies MOI. She notes progressive worsening of LT lateral foot and ankle pain and intermittent swelling. She uses a gel occasionally but denies taking medication for this issue otherwise. She had xrays which were unremarkable except noting bone spurs near heel area.   PERTINENT HISTORY: NA  PAIN:  Are you having pain? No : NPRS scale: 0/10 Pain location: Lt lateral foot/ ankle  Pain description: aching, burning  Aggravating factors: WB, walking Relieving factors: non WB   PRECAUTIONS: None  WEIGHT BEARING RESTRICTIONS No  FALLS:  Has patient fallen in last 6 months? Yes. 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)    PLOF: Independent  PATIENT GOALS Be more mobile, improve balance    OBJECTIVE:   DIAGNOSTIC FINDINGS: Xrays   PATIENT SURVEYS:  FOTO 64% function   COGNITION:  Overall cognitive status: Within functional limits for tasks assessed     SENSATION: WFL  EDEMA:  Little to none noted  PALPATION: Minimal TTP about Lt lateral ankle   LOWER EXTREMITY ROM: Bilateral LE AROM WFL except moderate restriction in bilateral ankle DF ROM   Active ROM Right eval Left eval  Hip flexion    Hip extension    Hip abduction    Hip adduction    Hip internal rotation    Hip external rotation    Knee flexion    Knee extension    Ankle dorsiflexion 3 2  Ankle plantarflexion    Ankle inversion    Ankle eversion     (Blank rows = not tested)  LOWER  EXTREMITY MMT:  MMT Right eval Left  eval  Hip flexion 5 5  Hip extension 5 5  Hip abduction 4 4  Hip adduction    Hip internal rotation    Hip external rotation    Knee flexion    Knee extension 5 5  Ankle dorsiflexion 5 5  Ankle plantarflexion    Ankle inversion 5 5  Ankle eversion 5 5   (Blank rows = not tested)   GAIT: Slight decrease in stride length, no AD    TODAY'S TREATMENT: 05/29/22: Reviewed goals, educated importance of HEP compliance for maximal benefits, pt able to recall and demonstrate Seated:    Ankle eversion BTB 20x 5"   BTB Plantar flexion 20x   BTB inversion 20x   Standing:   Heel/toe raises 10x   Supine:   Bridge with BTB around thigh 10x   Sidelying:   Abd  Eval  Ankle eversion BTB x10 Calf stretch at wall 3 x 30"    PATIENT EDUCATION:  Education details: on eval findings, POC and HEP  Person educated: Patient Education method: Explanation Education comprehension: verbalized understanding   HOME EXERCISE PROGRAM: Access Code: [email or web address] URL: [email or web address] Date: 05/28/2022 Prepared by: Josue Hector  Exercises - Long Sitting Ankle Eversion with Resistance  - 2-3 x daily - 7 x weekly - 3 sets - 10 reps - Gastroc Stretch on Wall  - 2-3 x daily - 7 x weekly - 1 sets - 3 reps - 30 second hold  ASSESSMENT:  CLINICAL IMPRESSION: Patient is a 66 y.o. female who presents to physical therapy with complaint of LT foot and ankle pain. Patient demonstrates muscle weakness, reduced ROM, and fascial restrictions which are likely contributing to symptoms of pain and are negatively impacting patient ability to perform ADLs and functional mobility tasks. Patient will benefit from skilled physical therapy services to address these deficits to reduce pain and improve level of function with ADLs and functional mobility tasks.  Reviewed goals, educated importance of HEP compliance for maximal benefits, pt able to recall and  demonstrate appropriate mechanics.  Session focus with ankle and hip strengthening as well as ankle mobility.  Progress to standing heel/toe raises and mat hip strengthening exercises, pt able to demonstrated good mechanics with all exercises.  Additional exercises add to HEP with printout given and verbalized understanding.  Pt plans to travel to [place] next week, will return second week in July.  No reports of pain through session.    OBJECTIVE IMPAIRMENTS Abnormal gait, decreased balance, decreased ROM, decreased strength, increased fascial restrictions, impaired flexibility, improper body mechanics, and pain.   ACTIVITY LIMITATIONS standing, squatting, stairs, transfers, and locomotion level  PARTICIPATION LIMITATIONS: meal prep, cleaning, laundry, shopping, community activity, occupation, and yard work  PERSONAL FACTORS  none  are also affecting patient's functional outcome.   REHAB POTENTIAL: Good  CLINICAL DECISION MAKING: Stable/uncomplicated  EVALUATION COMPLEXITY: Low   GOALS: SHORT TERM GOALS: Target date: 06/12/2022  Patient will be independent with initial HEP and self-management strategies to improve functional outcomes Baseline:  Goal status: IN PROGRESS   LONG TERM GOALS: Target date: 06/26/2022  Patient will be independent with advanced HEP and self-management strategies to improve functional outcomes Baseline:  Goal status: IN PROGRESS  2.  Patient will improve FOTO score to predicted value to indicate improvement in functional outcomes Baseline: 64% function  Goal status: IN PROGRESS  3.  Patient will report at least 75% overall improvement in subjective complaint to indicate improvement in ability to perform ADLs. Baseline:  Goal status: IN PROGRESS  4. Patient will have equal to or 5/5 MMT throughout BLE to improve ability to perform functional mobility, stair ambulation and ADLs.  Baseline: See MMT Goal status: IN PROGRESS   PLAN: PT FREQUENCY:  1-2x/week  PT DURATION: 4 weeks  PLANNED INTERVENTIONS: Therapeutic exercises, Therapeutic activity, Neuromuscular re-education, Balance training, Gait training, Patient/Family education, Joint manipulation, Joint mobilization, Stair training, Aquatic Therapy, Dry Needling, Electrical stimulation, Spinal manipulation, Spinal mobilization, Cryotherapy, Moist heat, scar mobilization, Taping, Traction, Ultrasound, Biofeedback, Ionotophoresis '4mg'$ /ml Dexamethasone, and Manual therapy.   PLAN FOR NEXT SESSION: Progress ankle stability and strength. Hip abduction strength and balance, stairs.  Add slant board next session.  Ihor Austin, LPTA/CLT; CBIS [phone]  10:13 AM, 05/29/22

## 2022-05-31 ENCOUNTER — Other Ambulatory Visit (HOSPITAL_COMMUNITY): Payer: Self-pay

## 2022-06-14 ENCOUNTER — Encounter (HOSPITAL_COMMUNITY): Payer: Self-pay | Admitting: Physical Therapy

## 2022-06-14 ENCOUNTER — Ambulatory Visit (HOSPITAL_COMMUNITY): Payer: 59 | Attending: Orthopedic Surgery | Admitting: Physical Therapy

## 2022-06-14 DIAGNOSIS — M25572 Pain in left ankle and joints of left foot: Secondary | ICD-10-CM | POA: Diagnosis not present

## 2022-06-14 DIAGNOSIS — M7672 Peroneal tendinitis, left leg: Secondary | ICD-10-CM | POA: Diagnosis not present

## 2022-06-14 DIAGNOSIS — M25561 Pain in right knee: Secondary | ICD-10-CM | POA: Diagnosis not present

## 2022-06-14 NOTE — Therapy (Signed)
OUTPATIENT PHYSICAL THERAPY LOWER EXTREMITY EVALUATION   Patient Name: Kathleen Wise MRN: [id number] DOB:Dec 26, 1955, 66 y.o., female Today's Date: 06/14/2022   PT End of Session - 06/14/22 1728     Visit Number 3    Number of Visits 8    Date for PT Re-Evaluation 06/25/22    Authorization Type [facility] [employer]    PT Start Time 1600    PT Stop Time 1640    PT Time Calculation (min) 40 min    Activity Tolerance Patient tolerated treatment well    Behavior During Therapy WFL for tasks assessed/performed              Past Medical History:  Diagnosis Date   Anxiety    DUB (dysfunctional uterine bleeding)    GERD (gastroesophageal reflux disease)    Pelvic pain in female    Uterus, adenomyosis    Wears contact lenses    Past Surgical History:  Procedure Laterality Date   COLONOSCOPY     HYSTEROSCOPY WITH D & C  09/24/2012   Procedure: DILATATION AND CURETTAGE /HYSTEROSCOPY;  Surgeon: Maisie Fus, MD;  Location: [facility];  Service: Gynecology;  Laterality: N/A;   LAPAROSCOPIC ASSISTED VAGINAL HYSTERECTOMY N/A 11/01/2014   Procedure: LAPAROSCOPIC ASSISTED VAGINAL HYSTERECTOMY;  Surgeon: Maisie Fus, MD;  Location[facility];  Service: Gynecology;  Laterality: N/A;   SALPINGOOPHORECTOMY Bilateral 11/01/2014   Procedure: SALPINGO OOPHORECTOMY;  Surgeon: Maisie Fus, MD;  Location: [facility];  Service: Gynecology;  Laterality: Bilateral;   Patient Active Problem List   Diagnosis Date Noted   Elevated coronary artery calcium score 11/05/2021   Family history of early CAD 09/09/2019   Essential hypertension 09/09/2019   Mixed hyperlipidemia 09/09/2019   S/P laparoscopic assisted vaginal hysterectomy (LAVH) 11/01/2014    PCP: Josetta Huddle MD  REFERRING PROVIDER: Wylene Simmer, MD   REFERRING DIAG: peroneal tendinitis (LT)  THERAPY DIAG:  Pain in left ankle and joints of left foot  Rationale for Evaluation and Treatment  Rehabilitation  ONSET DATE: March 2023  SUBJECTIVE:   SUBJECTIVE STATEMENT: Right now my Rt knee is bothering me the most.  She thinks her knee is bothering her secondary to walking different and going down steps different.   PERTINENT HISTORY: NA  PAIN:  Are you having pain? No : NPRS scale: 6/10 Rt knee; 2/10 Lt ankle Pain location: Lt lateral foot/ ankle  Pain description: aching, burning  Aggravating factors: WB, walking Relieving factors: non WB   PRECAUTIONS: None  WEIGHT BEARING RESTRICTIONS No  FALLS:  Has patient fallen in last 6 months? Yes. 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)    PLOF: Independent  PATIENT GOALS Be more mobile, improve balance    OBJECTIVE:   DIAGNOSTIC FINDINGS: Xrays   PATIENT SURVEYS:  FOTO 64% function   COGNITION:  Overall cognitive status: Within functional limits for tasks assessed     SENSATION: WFL  EDEMA:  Little to none noted   PALPATION: Minimal TTP about Lt lateral ankle   LOWER EXTREMITY ROM: Bilateral LE AROM WFL except moderate restriction in bilateral ankle DF ROM   Active ROM Right eval Left eval  Hip flexion    Hip extension    Hip abduction    Hip adduction    Hip internal rotation  Hip external rotation    Knee flexion    Knee extension    Ankle dorsiflexion 3 2  Ankle plantarflexion    Ankle inversion    Ankle eversion     (Blank rows = not tested)  LOWER EXTREMITY MMT:  MMT Right eval Left  eval  Hip flexion 5 5  Hip extension 5 5  Hip abduction 4 4  Hip adduction    Hip internal rotation    Hip external rotation    Knee flexion    Knee extension 5 5  Ankle dorsiflexion 5 5  Ankle plantarflexion    Ankle inversion 5 5  Ankle eversion 5 5   (Blank rows = not tested)   GAIT: Slight decrease in stride  length, no AD    TODAY'S TREATMENT:             06/14/2022 Standing:              Tandem stance 5 x with each foot forward as long as possible.              Heel raises up on both down on LT x 10              Toe raises x 10                Rocker board dorsi/plantar x 2 minutes                Baps board level 2  Dorsi/plantar; in/eversion x10; clock and counterclockwise x 5               Lunge onto 4" board x 10" hold x 5               Slant board stretch 3 x 30"               Sitting:  Lt ankle inversion:  3# x 10; 5# x 10               Rt side lying:  Lt ankle eversion 3# x 10, 5# x 7              Lt hip abduction 5 # x10   05/29/22: Reviewed goals, educated importance of HEP compliance for maximal benefits, pt able to recall and demonstrate Seated:    Ankle eversion BTB 20x 5"   BTB Plantar flexion 20x   BTB inversion 20x   Standing:   Heel/toe raises 10x   Supine:   Bridge with BTB around thigh 10x   Sidelying:   Abd  Eval  Ankle eversion BTB x10 Calf stretch at wall 3 x 30"    PATIENT EDUCATION:  Education details: on eval findings, POC and HEP  Person educated: Patient Education method: Explanation Education comprehension: verbalized understanding   HOME EXERCISE PROGRAM: Access Code: [email or web address] URL: [email or web address] Date: 05/28/2022 Prepared by: Josue Hector  Exercises - Long Sitting Ankle Eversion with Resistance  - 2-3 x daily - 7 x weekly - 3 sets - 10 reps - Gastroc Stretch on Wall  - 2-3 x daily - 7 x weekly - 1 sets - 3 reps - 30 second hold  ASSESSMENT:  CLINICAL IMPRESSION: Pt states that her ankle is better.  She saw the MD today who noted that she was stronger.  She is still having pain with steps.  PT will continue to benefit from skilled PT to improve functional mobility and strength.   OBJECTIVE IMPAIRMENTS Abnormal  gait, decreased balance, decreased ROM, decreased strength, increased fascial restrictions, impaired  flexibility, improper body mechanics, and pain.   ACTIVITY LIMITATIONS standing, squatting, stairs, transfers, and locomotion level  PARTICIPATION LIMITATIONS: meal prep, cleaning, laundry, shopping, community activity, occupation, and yard work  PERSONAL FACTORS  none  are also affecting patient's functional outcome.   REHAB POTENTIAL: Good  CLINICAL DECISION MAKING: Stable/uncomplicated  EVALUATION COMPLEXITY: Low   GOALS: SHORT TERM GOALS: Target date: 06/28/2022  Patient will be independent with initial HEP and self-management strategies to improve functional outcomes Baseline:  Goal status: IN PROGRESS   LONG TERM GOALS: Target date: 07/12/2022  Patient will be independent with advanced HEP and self-management strategies to improve functional outcomes Baseline:  Goal status: IN PROGRESS  2.  Patient will improve FOTO score to predicted value to indicate improvement in functional outcomes Baseline: 64% function  Goal status: IN PROGRESS  3.  Patient will report at least 75% overall improvement in subjective complaint to indicate improvement in ability to perform ADLs. Baseline:  Goal status: IN PROGRESS  4. Patient will have equal to or 5/5 MMT throughout BLE to improve ability to perform functional mobility, stair ambulation and ADLs.  Baseline: See MMT Goal status: IN PROGRESS   PLAN: PT FREQUENCY: 1-2x/week  PT DURATION: 4 weeks  PLANNED INTERVENTIONS: Therapeutic exercises, Therapeutic activity, Neuromuscular re-education, Balance training, Gait training, Patient/Family education, Joint manipulation, Joint mobilization, Stair training, Aquatic Therapy, Dry Needling, Electrical stimulation, Spinal manipulation, Spinal mobilization, Cryotherapy, Moist heat, scar mobilization, Taping, Traction, Ultrasound, Biofeedback, Ionotophoresis '4mg'$ /ml Dexamethasone, and Manual therapy.   PLAN FOR NEXT SESSION:  begin step up and step down on 4" step if not painfull progress  to step. Progress ankle stability and strength. Hip abduction strength and balance, stairs.  Rayetta Humphrey, PT CLT [phone]  4:42 PM, 06/14/22

## 2022-06-17 ENCOUNTER — Other Ambulatory Visit (HOSPITAL_COMMUNITY): Payer: Self-pay

## 2022-06-18 ENCOUNTER — Ambulatory Visit (HOSPITAL_COMMUNITY): Payer: 59

## 2022-06-18 ENCOUNTER — Other Ambulatory Visit (HOSPITAL_COMMUNITY): Payer: Self-pay

## 2022-06-18 ENCOUNTER — Encounter (HOSPITAL_COMMUNITY): Payer: Self-pay

## 2022-06-18 DIAGNOSIS — M25572 Pain in left ankle and joints of left foot: Secondary | ICD-10-CM | POA: Diagnosis not present

## 2022-06-18 NOTE — Therapy (Signed)
OUTPATIENT PHYSICAL THERAPY LOWER EXTREMITY TREATMENT   Patient Name: JANEIL SCHEXNAYDER MRN: [id number] DOB:[birth year], 66 y.o., female Today's Date: 06/18/2022   PT End of Session - 06/18/22 0907     Visit Number 4    Number of Visits 8    Date for PT Re-Evaluation 06/25/22    Authorization Type [facility]    PT Start Time 3151    PT Stop Time [phone]    PT Time Calculation (min) 39 min    Activity Tolerance Patient tolerated treatment well    Behavior During Therapy [facility] for tasks assessed/performed               Past Medical History:  Diagnosis Date   Anxiety    DUB (dysfunctional uterine bleeding)    GERD (gastroesophageal reflux disease)    Pelvic pain in female    Uterus, adenomyosis    Wears contact lenses    Past Surgical History:  Procedure Laterality Date   COLONOSCOPY     HYSTEROSCOPY WITH D & C  09/24/2012   Procedure: DILATATION AND CURETTAGE /HYSTEROSCOPY;  Surgeon: Maisie Fus, MD;  Location: [facility];  Service: Gynecology;  Laterality: N/A;   LAPAROSCOPIC ASSISTED VAGINAL HYSTERECTOMY N/A 11/01/2014   Procedure: LAPAROSCOPIC ASSISTED VAGINAL HYSTERECTOMY;  Surgeon: Maisie Fus, MD;  Location: [facility];  Service: Gynecology;  Laterality: N/A;   SALPINGOOPHORECTOMY Bilateral 11/01/2014   Procedure: SALPINGO OOPHORECTOMY;  Surgeon: Maisie Fus, MD;  Location: [facility];  Service: Gynecology;  Laterality: Bilateral;   Patient Active Problem List   Diagnosis Date Noted   Elevated coronary artery calcium score 11/05/2021   Family history of early CAD 09/09/2019   Essential hypertension 09/09/2019   Mixed hyperlipidemia 09/09/2019   S/P laparoscopic assisted vaginal hysterectomy (LAVH) 11/01/2014    PCP: Josetta Huddle MD  REFERRING PROVIDER: Wylene Simmer, MD   REFERRING DIAG: peroneal tendinitis (LT)  THERAPY DIAG:  Pain in left ankle and joints of left foot  Rationale for Evaluation and Treatment  Rehabilitation  ONSET DATE: March 2023  SUBJECTIVE:   SUBJECTIVE STATEMENT: Lt ankle is feeling good today, states she has Rt knee pain today.  Reports most difficulty standing from chair and pain getting out of car.  PERTINENT HISTORY: NA  PAIN:  Are you having pain? No : NPRS scale: 6/10 Rt knee; 0/10 Lt ankle Pain location: Lt lateral foot/ ankle  Pain description: aching, burning  Aggravating factors: WB, walking Relieving factors: non WB   PRECAUTIONS: None  WEIGHT BEARING RESTRICTIONS No  FALLS:  Has patient fallen in last 6 months? Yes. 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[employer])    PLOF: Independent  PATIENT GOALS Be more mobile, improve balance    OBJECTIVE:   DIAGNOSTIC FINDINGS: Xrays   PATIENT SURVEYS:  FOTO 64% function   COGNITION:  Overall cognitive status: Within functional limits for tasks assessed     SENSATION: WFL  EDEMA:  Little to none noted   PALPATION: Minimal TTP about Lt lateral ankle   LOWER EXTREMITY ROM: Bilateral LE AROM WFL except moderate restriction in bilateral ankle DF ROM   Active ROM Right eval Left eval  Hip flexion    Hip extension    Hip abduction    Hip adduction    Hip internal rotation  Hip external rotation    Knee flexion    Knee extension    Ankle dorsiflexion 3 2  Ankle plantarflexion    Ankle inversion    Ankle eversion     (Blank rows = not tested)  LOWER EXTREMITY MMT:  MMT Right eval Left  eval  Hip flexion 5 5  Hip extension 5 5  Hip abduction 4 4  Hip adduction    Hip internal rotation    Hip external rotation    Knee flexion    Knee extension 5 5  Ankle dorsiflexion 5 5  Ankle plantarflexion    Ankle inversion 5 5  Ankle eversion 5 5   (Blank rows = not tested)   GAIT: Slight decrease in stride length, no AD     TODAY'S TREATMENT:             06/18/22  Standing: Heel raises up on both down on LT x 10  Squat front of chair 10x  STS increase pain Rt knee stopped following rep 6  Forward lunge onto 4in step height 10 x5 " holds minimal HHA.  Rockerboard Lateral x  2 min then DF/PF x 2 min  BAPS board L2 Dorsi/plantar; in/eversion x10; clock and counterclockwise x 5  Tandem stance on foam 3x 30"  Slant board 3x 30"  Forward step up 4in step height 10x  Step down 4in step height 10x  Bodycraft 3Pl retro then sidestep 4RT  06/14/2022 Standing:              Tandem stance 5 x with each foot forward as long as possible.              Heel raises up on both down on LT x 10              Toe raises x 10                Rocker board dorsi/plantar x 2 minutes                Baps board level 2  Dorsi/plantar; in/eversion x10; clock and counterclockwise x 5               Lunge onto 4" board x 10" hold x 5               Slant board stretch 3 x 30"               Sitting:  Lt ankle inversion:  3# x 10; 5# x 10               Rt side lying:  Lt ankle eversion 3# x 10, 5# x 7              Lt hip abduction 5 # x10   05/29/22: Reviewed goals, educated importance of HEP compliance for maximal benefits, pt able to recall and demonstrate Seated:    Ankle eversion BTB 20x 5"   BTB Plantar flexion 20x   BTB inversion 20x   Standing:   Heel/toe raises 10x   Supine:   Bridge with BTB around thigh 10x   Sidelying:   Abd  Eval  Ankle eversion BTB x10 Calf stretch at wall 3 x 30"    PATIENT EDUCATION:  Education details: on eval findings, POC and HEP  Person educated: Patient Education method: Explanation Education comprehension: verbalized understanding   HOME EXERCISE PROGRAM: Access Code: [email or web address] URL: [email or web address] Date: 05/28/2022 Prepared by: Lysbeth Galas  Caffaro  Exercises - Long Sitting Ankle Eversion with Resistance  - 2-3 x daily - 7 x weekly - 3 sets - 10 reps -  Gastroc Stretch on Wall  - 2-3 x daily - 7 x weekly - 1 sets - 3 reps - 30 second hold  ASSESSMENT:  CLINICAL IMPRESSION: Pt states that her ankle is better.  She saw the MD today who noted that she was stronger.  She is still having pain with steps.  PT will continue to benefit from skilled PT to improve functional mobility and strength.    Session focus with ankle, knee and hip stability and functional strengthening.  Pt limited by Rt knee pain with STS.  Added squats and gluteal strengthening exercises to POC with ability to complete in pain free range following cueing for mechanics.  Added step up training on 4in step for quad strengthening and retro/sidestepping for gluteal strengthening and balance training.  Pt tolerated well.  No reports of increased pain at EOS.     OBJECTIVE IMPAIRMENTS Abnormal gait, decreased balance, decreased ROM, decreased strength, increased fascial restrictions, impaired flexibility, improper body mechanics, and pain.   ACTIVITY LIMITATIONS standing, squatting, stairs, transfers, and locomotion level  PARTICIPATION LIMITATIONS: meal prep, cleaning, laundry, shopping, community activity, occupation, and yard work  PERSONAL FACTORS  none  are also affecting patient's functional outcome.   REHAB POTENTIAL: Good  CLINICAL DECISION MAKING: Stable/uncomplicated  EVALUATION COMPLEXITY: Low   GOALS: SHORT TERM GOALS: Target date: 07/02/2022  Patient will be independent with initial HEP and self-management strategies to improve functional outcomes Baseline:  Goal status: IN PROGRESS   LONG TERM GOALS: Target date: 07/16/2022  Patient will be independent with advanced HEP and self-management strategies to improve functional outcomes Baseline:  Goal status: IN PROGRESS  2.  Patient will improve FOTO score to predicted value to indicate improvement in functional outcomes Baseline: 64% function  Goal status: IN PROGRESS  3.  Patient will report at least 75%  overall improvement in subjective complaint to indicate improvement in ability to perform ADLs. Baseline:  Goal status: IN PROGRESS  4. Patient will have equal to or 5/5 MMT throughout BLE to improve ability to perform functional mobility, stair ambulation and ADLs.  Baseline: See MMT Goal status: IN PROGRESS   PLAN: PT FREQUENCY: 1-2x/week  PT DURATION: 4 weeks  PLANNED INTERVENTIONS: Therapeutic exercises, Therapeutic activity, Neuromuscular re-education, Balance training, Gait training, Patient/Family education, Joint manipulation, Joint mobilization, Stair training, Aquatic Therapy, Dry Needling, Electrical stimulation, Spinal manipulation, Spinal mobilization, Cryotherapy, Moist heat, scar mobilization, Taping, Traction, Ultrasound, Biofeedback, Ionotophoresis '4mg'$ /ml Dexamethasone, and Manual therapy.   PLAN FOR NEXT SESSION:  begin step down on 4" step if not painfull progress to step. Progress ankle stability and strength. Hip abduction strength and balance, stairs.  Ihor Austin, LPTA/CLT; CBIS [phone]  4:42 PM, 06/18/22

## 2022-06-20 ENCOUNTER — Other Ambulatory Visit (HOSPITAL_COMMUNITY): Payer: Self-pay

## 2022-06-20 MED ORDER — SUTAB 1479-225-188 MG PO TABS
ORAL_TABLET | ORAL | 0 refills | Status: AC
  Filled 2022-06-20: qty 24, 1d supply, fill #0

## 2022-06-21 ENCOUNTER — Other Ambulatory Visit (HOSPITAL_COMMUNITY): Payer: Self-pay

## 2022-06-21 ENCOUNTER — Encounter (HOSPITAL_COMMUNITY): Payer: Self-pay

## 2022-06-21 ENCOUNTER — Ambulatory Visit (HOSPITAL_COMMUNITY): Payer: 59

## 2022-06-21 DIAGNOSIS — M25572 Pain in left ankle and joints of left foot: Secondary | ICD-10-CM | POA: Diagnosis not present

## 2022-06-21 NOTE — Therapy (Signed)
OUTPATIENT PHYSICAL THERAPY LOWER EXTREMITY TREATMENT   Patient Name: Kathleen Wise MRN: [id number] DOB:1955/12/07, 66 y.o., female Today's Date: 06/21/2022   PT End of Session - 06/21/22 0900     Visit Number 5    Number of Visits 8    Date for PT Re-Evaluation 06/25/22    Authorization Type [facility]    PT Start Time 0900    PT Stop Time 0940    PT Time Calculation (min) 40 min    Activity Tolerance Patient tolerated treatment well    Behavior During Therapy [facility] for tasks assessed/performed               Past Medical History:  Diagnosis Date   Anxiety    DUB (dysfunctional uterine bleeding)    GERD (gastroesophageal reflux disease)    Pelvic pain in female    Uterus, adenomyosis    Wears contact lenses    Past Surgical History:  Procedure Laterality Date   COLONOSCOPY     HYSTEROSCOPY WITH D & C  09/24/2012   Procedure: DILATATION AND CURETTAGE /HYSTEROSCOPY;  Surgeon: Maisie Fus, MD;  Location: [facility];  Service: Gynecology;  Laterality: N/A;   LAPAROSCOPIC ASSISTED VAGINAL HYSTERECTOMY N/A 11/01/2014   Procedure: LAPAROSCOPIC ASSISTED VAGINAL HYSTERECTOMY;  Surgeon: Maisie Fus, MD;  Location: [facility];  Service: Gynecology;  Laterality: N/A;   SALPINGOOPHORECTOMY Bilateral 11/01/2014   Procedure: SALPINGO OOPHORECTOMY;  Surgeon: Maisie Fus, MD;  Location: [facility];  Service: Gynecology;  Laterality: Bilateral;   Patient Active Problem List   Diagnosis Date Noted   Elevated coronary artery calcium score 11/05/2021   Family history of early CAD 09/09/2019   Essential hypertension 09/09/2019   Mixed hyperlipidemia 09/09/2019   S/P laparoscopic assisted vaginal hysterectomy (LAVH) 11/01/2014    PCP: Josetta Huddle MD  REFERRING PROVIDER: Wylene Simmer, MD   REFERRING DIAG: peroneal tendinitis (LT)  THERAPY DIAG:  Pain in left ankle and joints of left foot  Rationale for Evaluation and Treatment  Rehabilitation  ONSET DATE: March 2023  SUBJECTIVE:   SUBJECTIVE STATEMENT:  Pt reports right knee pain that has been persistent since left ankle injury. Pt reports she has been to MD who was making referral to knee specialist, pt has not gone to follow up as she is afraid that she would knee surgery. Pt reports left lateral foot gets ome burning with increased time spent walking.    PERTINENT HISTORY: NA  PAIN:  Are you having pain? No : NPRS scale: 6/10 Rt knee; 0/10 Lt ankle Pain location: Lt lateral foot/ ankle  Pain description: aching, burning  Aggravating factors: WB, walking Relieving factors: non WB   PRECAUTIONS: None  WEIGHT BEARING RESTRICTIONS No  FALLS:  Has patient fallen in last 6 months? Yes. 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)    PLOF: Independent  PATIENT GOALS Be more mobile, improve balance    OBJECTIVE:   DIAGNOSTIC FINDINGS: Xrays   PATIENT SURVEYS:  FOTO 64% function   COGNITION:  Overall cognitive status: Within functional limits for tasks assessed     SENSATION: WFL  EDEMA:  Little to none noted   PALPATION: Minimal TTP about Lt lateral ankle   LOWER EXTREMITY ROM: Bilateral LE AROM WFL except moderate restriction in bilateral ankle DF ROM  Active ROM Right eval Left eval  Hip flexion    Hip extension    Hip abduction    Hip adduction    Hip internal rotation    Hip external rotation    Knee flexion    Knee extension    Ankle dorsiflexion 3 2  Ankle plantarflexion    Ankle inversion    Ankle eversion     (Blank rows = not tested)  LOWER EXTREMITY MMT:  MMT Right eval Left  eval  Hip flexion 5 5  Hip extension 5 5  Hip abduction 4 4  Hip adduction    Hip internal rotation    Hip external rotation    Knee flexion    Knee extension 5 5   Ankle dorsiflexion 5 5  Ankle plantarflexion    Ankle inversion 5 5  Ankle eversion 5 5   (Blank rows = not tested)   GAIT: Slight decrease in stride length, no AD    TODAY'S TREATMENT:  7/21/223  Slant board stretch  Circle wobble board a/p, m/l x10 taps each with b/l UE support, in left sls  Tennis ball bottom of foot rolls x2 mins  Heel raises with ankle roll out/in x15  Foam sls 20 sec holds x3  Firm ground sls with contralateral LE yoga block flips x15  70# body craft heel raises b/l x20  Foam beam tandem walks x6 laps  Foam beam lateral step with cone tap outs standing on left LE x6 laps   Bosu lunge anterior with left LE, 5 sec hold x10 times  Inverted bosu static hold-> a/p shifts  Round side up slow and controlled single leg march with touch Ue support    All at [facility]               06/18/22  Standing: Heel raises up on both down on LT x 10  Squat front of chair 10x  STS increase pain Rt knee stopped following rep 6  Forward lunge onto 4in step height 10 x5 " holds minimal HHA.  Rockerboard Lateral x  2 min then DF/PF x 2 min  BAPS board L2 Dorsi/plantar; in/eversion x10; clock and counterclockwise x 5  Tandem stance on foam 3x 30"  Slant board 3x 30"  Forward step up 4in step height 10x  Step down 4in step height 10x  Bodycraft 3Pl retro then sidestep 4RT  06/14/2022 Standing:              Tandem stance 5 x with each foot forward as long as possible.              Heel raises up on both down on LT x 10              Toe raises x 10                Rocker board dorsi/plantar x 2 minutes                Baps board level 2  Dorsi/plantar; in/eversion x10; clock and counterclockwise x 5               Lunge onto 4" board x 10" hold x 5               Slant board stretch 3 x 30"               Sitting:  Lt ankle inversion:  3# x 10; 5# x 10  Rt side lying:  Lt ankle eversion 3# x 10, 5# x 7              Lt hip abduction 5 # x10   05/29/22: Reviewed  goals, educated importance of HEP compliance for maximal benefits, pt able to recall and demonstrate Seated:    Ankle eversion BTB 20x 5"   BTB Plantar flexion 20x   BTB inversion 20x   Standing:   Heel/toe raises 10x   Supine:   Bridge with BTB around thigh 10x   Sidelying:   Abd  Eval  Ankle eversion BTB x10 Calf stretch at wall 3 x 30"    PATIENT EDUCATION:  Education details: on eval findings, POC and HEP  Person educated: Patient Education method: Explanation Education comprehension: verbalized understanding   HOME EXERCISE PROGRAM: Access Code: [email or web address] URL: [email or web address] Date: 05/28/2022 Prepared by: Josue Hector  Exercises - Long Sitting Ankle Eversion with Resistance  - 2-3 x daily - 7 x weekly - 3 sets - 10 reps - Gastroc Stretch on Wall  - 2-3 x daily - 7 x weekly - 1 sets - 3 reps - 30 second hold  ASSESSMENT:  CLINICAL IMPRESSION: Pt tolerates session well. Pt with no reports of ankle pain during session. Pt reports some right knee fatigue post bosu balance exercises. Pt with more stability on left LE in some exercises compared to right. Spoke to pt about upcoming PN and that after assessing measures and goals that pt may be ready for discharge. Continue with plan into next PN and reassess POC.    OBJECTIVE IMPAIRMENTS Abnormal gait, decreased balance, decreased ROM, decreased strength, increased fascial restrictions, impaired flexibility, improper body mechanics, and pain.   ACTIVITY LIMITATIONS standing, squatting, stairs, transfers, and locomotion level  PARTICIPATION LIMITATIONS: meal prep, cleaning, laundry, shopping, community activity, occupation, and yard work  PERSONAL FACTORS  none  are also affecting patient's functional outcome.   REHAB POTENTIAL: Good  CLINICAL DECISION MAKING: Stable/uncomplicated  EVALUATION COMPLEXITY: Low   GOALS: SHORT TERM GOALS: Target date: 07/05/2022  Patient will be independent with  initial HEP and self-management strategies to improve functional outcomes Baseline:  Goal status: IN PROGRESS   LONG TERM GOALS: Target date: 07/19/2022  Patient will be independent with advanced HEP and self-management strategies to improve functional outcomes Baseline:  Goal status: IN PROGRESS  2.  Patient will improve FOTO score to predicted value to indicate improvement in functional outcomes Baseline: 64% function  Goal status: IN PROGRESS  3.  Patient will report at least 75% overall improvement in subjective complaint to indicate improvement in ability to perform ADLs. Baseline:  Goal status: IN PROGRESS  4. Patient will have equal to or 5/5 MMT throughout BLE to improve ability to perform functional mobility, stair ambulation and ADLs.  Baseline: See MMT Goal status: IN PROGRESS   PLAN: PT FREQUENCY: 1-2x/week  PT DURATION: 4 weeks  PLANNED INTERVENTIONS: Therapeutic exercises, Therapeutic activity, Neuromuscular re-education, Balance training, Gait training, Patient/Family education, Joint manipulation, Joint mobilization, Stair training, Aquatic Therapy, Dry Needling, Electrical stimulation, Spinal manipulation, Spinal mobilization, Cryotherapy, Moist heat, scar mobilization, Taping, Traction, Ultrasound, Biofeedback, Ionotophoresis '4mg'$ /ml Dexamethasone, and Manual therapy.   PLAN FOR NEXT SESSION:  begin step down on 4" step if not painfull progress to step. Progress ankle stability and strength. Hip abduction strength and balance, stairs.  Tal Neer PT, DPT   4:42 PM, 06/21/22

## 2022-06-24 ENCOUNTER — Encounter (HOSPITAL_COMMUNITY): Payer: Self-pay | Admitting: Physical Therapy

## 2022-06-24 ENCOUNTER — Ambulatory Visit (HOSPITAL_COMMUNITY): Payer: 59 | Admitting: Physical Therapy

## 2022-06-24 DIAGNOSIS — M25572 Pain in left ankle and joints of left foot: Secondary | ICD-10-CM | POA: Diagnosis not present

## 2022-06-24 NOTE — Therapy (Signed)
OUTPATIENT PHYSICAL THERAPY LOWER EXTREMITY TREATMENT   Patient Name: Kathleen Wise MRN: [id number] DOB:[birth year], 66 y.o., female Today's Date: 06/24/2022  Progress Note Reporting Period 05/28/22 to 06/24/22  See note below for Objective Data and Assessment of Progress/Goals.      PT End of Session - 06/24/22 1649     Visit Number 6    Number of Visits 10    Date for PT Re-Evaluation 07/22/22    Authorization Type [employer]    PT Start Time [phone]    PT Stop Time 2119    PT Time Calculation (min) 38 min    Activity Tolerance Patient tolerated treatment well    Behavior During Therapy WFL for tasks assessed/performed               Past Medical History:  Diagnosis Date   Anxiety    DUB (dysfunctional uterine bleeding)    GERD (gastroesophageal reflux disease)    Pelvic pain in female    Uterus, adenomyosis    Wears contact lenses    Past Surgical History:  Procedure Laterality Date   COLONOSCOPY     HYSTEROSCOPY WITH D & C  09/24/2012   Procedure: DILATATION AND CURETTAGE /HYSTEROSCOPY;  Surgeon: Maisie Fus, MD;  Location: [facility];  Service: Gynecology;  Laterality: N/A;   LAPAROSCOPIC ASSISTED VAGINAL HYSTERECTOMY N/A 11/01/2014   Procedure: LAPAROSCOPIC ASSISTED VAGINAL HYSTERECTOMY;  Surgeon: Maisie Fus, MD;  Location: [facility];  Service: Gynecology;  Laterality: N/A;   SALPINGOOPHORECTOMY Bilateral 11/01/2014   Procedure: SALPINGO OOPHORECTOMY;  Surgeon: Maisie Fus, MD;  Location: [facility];  Service: Gynecology;  Laterality: Bilateral;   Patient Active Problem List   Diagnosis Date Noted   Elevated coronary artery calcium score 11/05/2021   Family history of early CAD 09/09/2019   Essential hypertension 09/09/2019   Mixed hyperlipidemia 09/09/2019   S/P laparoscopic assisted vaginal hysterectomy (LAVH) 11/01/2014    PCP: Josetta Huddle MD  REFERRING PROVIDER: Wylene Simmer, MD   REFERRING DIAG: peroneal  tendinitis (LT)  THERAPY DIAG:  Pain in left ankle and joints of left foot - Plan: PT plan of care cert/re-cert  Rationale for Evaluation and Treatment Rehabilitation  ONSET DATE: March 2023  SUBJECTIVE:   SUBJECTIVE STATEMENT: Patient states she is doing much better. Reports 75% improvement since starting therapy. No pain currently. Improved ambulation and stair navigation. Knee is doing better as well. Doing fine with HEP.    PERTINENT HISTORY: NA  PAIN:  Are you having pain? No   PRECAUTIONS: None  WEIGHT BEARING RESTRICTIONS No  FALLS:  Has patient fallen in last 6 months? Yes. 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[employer])    PLOF: Independent  PATIENT GOALS Be more mobile, improve balance    OBJECTIVE:   DIAGNOSTIC FINDINGS: Xrays   PATIENT SURVEYS:  FOTO 69% function   COGNITION:  Overall cognitive status: Within functional limits for tasks assessed     SENSATION: WFL  EDEMA:  Little to none noted   PALPATION: Minimal TTP about Lt lateral ankle   LOWER EXTREMITY ROM: Bilateral LE AROM WFL except moderate restriction in bilateral ankle DF ROM   Active ROM Right eval Left eval  Hip flexion    Hip extension    Hip abduction    Hip adduction  Hip internal rotation    Hip external rotation    Knee flexion    Knee extension    Ankle dorsiflexion 3 2  Ankle plantarflexion    Ankle inversion    Ankle eversion     (Blank rows = not tested)  LOWER EXTREMITY MMT:  MMT Right eval Left  eval Left  06/24/22  Hip flexion _0 Hip extension _1 Hip abduction 4 4 4+  Hip adduction     Hip internal rotation     Hip external rotation     Knee flexion     Knee extension _2 Ankle dorsiflexion _3 Ankle plantarflexion   5  Ankle inversion _4 Ankle eversion _5 (Blank rows = not tested)   GAIT: Slight decrease in stride length, no AD    TODAY'S TREATMENT:  06/24/22 Reassess FOTO 69%  AROM MMT  Lunges on BOSU no HHA x 15 each  Step ups on BOSU x10 each int HHA    7/21/223  Slant board stretch  Circle wobble board a/p, m/l x10 taps each with b/l UE support, in left sls  Tennis ball bottom of foot rolls x2 mins  Heel raises with ankle roll out/in x15  Foam sls 20 sec holds x3  Firm ground sls with contralateral LE yoga block flips x15  70# body craft heel raises b/l x20  Foam beam tandem walks x6 laps  Foam beam lateral step with cone tap outs standing on left LE x6 laps   Bosu lunge anterior with left LE, 5 sec hold x10 times  Inverted bosu static hold-> a/p shifts  Round side up slow and controlled single leg march with touch Ue support    All at [facility]         PATIENT EDUCATION:  Education details: on eval findings, POC and HEP  Person educated: Patient Education method: Explanation Education comprehension: verbalized understanding   HOME EXERCISE PROGRAM: Access Code: [email or web address] URL: [email or web address] Date: 05/28/2022 Prepared by: Josue Hector  Exercises - Long Sitting Ankle Eversion with Resistance  - 2-3 x daily - 7 x weekly - 3 sets - 10 reps - Gastroc Stretch on Wall  - 2-3 x daily - 7 x weekly - 1 sets - 3 reps - 30 second hold  ASSESSMENT:  CLINICAL IMPRESSION: Patient shows steady progress toward therapy goals. Patient demos good strength gains and reports significantly improved subjective statement. Patient remains limited by decreased activity tolerance and ankle pain with prolonged WB as well as stabilization on uneven surface (I.e walking outside). Patient will continue to benefit from skilled therapy services to address remaining deficits for decreased pain and improved LOF with ADLs.   OBJECTIVE IMPAIRMENTS Abnormal gait, decreased balance, decreased ROM, decreased strength, increased  fascial restrictions, impaired flexibility, improper body mechanics, and pain.   ACTIVITY LIMITATIONS standing, squatting, stairs, transfers, and locomotion level  PARTICIPATION LIMITATIONS: meal prep, cleaning, laundry, shopping, community activity, occupation, and yard work  PERSONAL FACTORS  none  are also affecting patient's functional outcome.   REHAB POTENTIAL: Good  CLINICAL DECISION MAKING: Stable/uncomplicated  EVALUATION COMPLEXITY: Low   GOALS: SHORT TERM GOALS: Target date: 07/08/2022  Patient will be independent with initial HEP and self-management strategies to improve functional outcomes Baseline:  Goal status: MET   LONG TERM GOALS: Target date: 07/22/2022  Patient will be independent with advanced HEP and self-management strategies to improve functional  outcomes Baseline:  Goal status: IN PROGRESS  2.  Patient will improve FOTO score to predicted value to indicate improvement in functional outcomes Baseline: 69% function  Goal status: Partially MET  3.  Patient will report at least 75% overall improvement in subjective complaint to indicate improvement in ability to perform ADLs. Baseline: Reports 75%  Goal status: MET  4. Patient will have equal to or 5/5 MMT throughout BLE to improve ability to perform functional mobility, stair ambulation and ADLs.  Baseline: See MMT Goal status: partially MET    PLAN: PT FREQUENCY: 1x/week  PT DURATION: 4 weeks  PLANNED INTERVENTIONS: Therapeutic exercises, Therapeutic activity, Neuromuscular re-education, Balance training, Gait training, Patient/Family education, Joint manipulation, Joint mobilization, Stair training, Aquatic Therapy, Dry Needling, Electrical stimulation, Spinal manipulation, Spinal mobilization, Cryotherapy, Moist heat, scar mobilization, Taping, Traction, Ultrasound, Biofeedback, Ionotophoresis 3m/ml Dexamethasone, and Manual therapy.   PLAN FOR NEXT SESSION:  Progress ankle stability and  strength. Hip abduction strength and balance, stairs.   5:27 PM, 06/24/22 CJosue HectorPT DPT  Physical [facility]rap[facility] [phone]

## 2022-06-26 DIAGNOSIS — K6389 Other specified diseases of intestine: Secondary | ICD-10-CM | POA: Diagnosis not present

## 2022-06-26 DIAGNOSIS — D122 Benign neoplasm of ascending colon: Secondary | ICD-10-CM | POA: Diagnosis not present

## 2022-06-26 DIAGNOSIS — K644 Residual hemorrhoidal skin tags: Secondary | ICD-10-CM | POA: Diagnosis not present

## 2022-06-26 DIAGNOSIS — K319 Disease of stomach and duodenum, unspecified: Secondary | ICD-10-CM | POA: Diagnosis not present

## 2022-06-26 DIAGNOSIS — K625 Hemorrhage of anus and rectum: Secondary | ICD-10-CM | POA: Diagnosis not present

## 2022-06-26 DIAGNOSIS — K5730 Diverticulosis of large intestine without perforation or abscess without bleeding: Secondary | ICD-10-CM | POA: Diagnosis not present

## 2022-06-26 DIAGNOSIS — D125 Benign neoplasm of sigmoid colon: Secondary | ICD-10-CM | POA: Diagnosis not present

## 2022-06-26 DIAGNOSIS — K648 Other hemorrhoids: Secondary | ICD-10-CM | POA: Diagnosis not present

## 2022-06-26 DIAGNOSIS — Z86010 Personal history of colonic polyps: Secondary | ICD-10-CM | POA: Diagnosis not present

## 2022-06-26 DIAGNOSIS — K2950 Unspecified chronic gastritis without bleeding: Secondary | ICD-10-CM | POA: Diagnosis not present

## 2022-06-26 DIAGNOSIS — D123 Benign neoplasm of transverse colon: Secondary | ICD-10-CM | POA: Diagnosis not present

## 2022-06-28 ENCOUNTER — Other Ambulatory Visit (HOSPITAL_COMMUNITY): Payer: Self-pay

## 2022-07-02 ENCOUNTER — Encounter (HOSPITAL_COMMUNITY): Payer: Self-pay

## 2022-07-02 ENCOUNTER — Ambulatory Visit (HOSPITAL_COMMUNITY): Payer: 59 | Attending: Orthopedic Surgery

## 2022-07-02 DIAGNOSIS — M25572 Pain in left ankle and joints of left foot: Secondary | ICD-10-CM | POA: Insufficient documentation

## 2022-07-02 NOTE — Therapy (Signed)
OUTPATIENT PHYSICAL THERAPY LOWER EXTREMITY TREATMENT   Patient Name: Kathleen Wise MRN: [id number] DOB:30-Aug-1956, 66 y.o., female Today's Date: 07/02/2022     PT End of Session - 07/02/22 0952     Visit Number 7    Number of Visits 10    Date for PT Re-Evaluation 07/22/22    Authorization Type [employer]    Progress Note Due on Visit 10    PT Start Time [phone]    PT Stop Time 1027    PT Time Calculation (min) 38 min    Activity Tolerance Patient tolerated treatment well    Behavior During Therapy [facility] for tasks assessed/performed               Past Medical History:  Diagnosis Date   Anxiety    DUB (dysfunctional uterine bleeding)    GERD (gastroesophageal reflux disease)    Pelvic pain in female    Uterus, adenomyosis    Wears contact lenses    Past Surgical History:  Procedure Laterality Date   COLONOSCOPY     HYSTEROSCOPY WITH D & C  09/24/2012   Procedure: DILATATION AND CURETTAGE /HYSTEROSCOPY;  Surgeon: Maisie Fus, MD;  Location: [facility];  Service: Gynecology;  Laterality: N/A;   LAPAROSCOPIC ASSISTED VAGINAL HYSTERECTOMY N/A 11/01/2014   Procedure: LAPAROSCOPIC ASSISTED VAGINAL HYSTERECTOMY;  Surgeon: Maisie Fus, MD;  Location[facility];  Service: Gynecology;  Laterality: N/A;   SALPINGOOPHORECTOMY Bilateral 11/01/2014   Procedure: SALPINGO OOPHORECTOMY;  Surgeon: Maisie Fus, MD;  Location: [facility];  Service: Gynecology;  Laterality: Bilateral;   Patient Active Problem List   Diagnosis Date Noted   Elevated coronary artery calcium score 11/05/2021   Family history of early CAD 09/09/2019   Essential hypertension 09/09/2019   Mixed hyperlipidemia 09/09/2019   S/P laparoscopic assisted vaginal hysterectomy (LAVH) 11/01/2014    PCP: Josetta Huddle MD  REFERRING PROVIDER: Wylene Simmer, MD   REFERRING DIAG: peroneal tendinitis (LT)  THERAPY DIAG:  Pain in left ankle and joints of left foot  Rationale for  Evaluation and Treatment Rehabilitation  ONSET DATE: March 2023  SUBJECTIVE:   SUBJECTIVE STATEMENT: Pt reports she is feeling good, feels therapy has been helpful.     PERTINENT HISTORY: NA  PAIN:  Are you having pain? No   PRECAUTIONS: None  WEIGHT BEARING RESTRICTIONS No  FALLS:  Has patient fallen in last 6 months? Yes. 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)    PLOF: Independent  PATIENT GOALS Be more mobile, improve balance    OBJECTIVE:   DIAGNOSTIC FINDINGS: Xrays   PATIENT SURVEYS:  FOTO 69% function   COGNITION:  Overall cognitive status: Within functional limits for tasks assessed     SENSATION: WFL  EDEMA:  Little to none noted   PALPATION: Minimal TTP about Lt lateral ankle   LOWER EXTREMITY ROM: Bilateral LE AROM WFL except moderate restriction in bilateral ankle DF ROM   Active ROM Right eval Left eval  Hip flexion    Hip extension    Hip abduction    Hip adduction    Hip internal rotation    Hip external rotation    Knee flexion    Knee extension    Ankle dorsiflexion 3 2  Ankle plantarflexion    Ankle inversion  Ankle eversion     (Blank rows = not tested)  LOWER EXTREMITY MMT:  MMT Right eval Left  eval Left  06/24/22  Hip flexion _0 Hip extension _1 Hip abduction 4 4 4+  Hip adduction     Hip internal rotation     Hip external rotation     Knee flexion     Knee extension _2 Ankle dorsiflexion _3 Ankle plantarflexion   5  Ankle inversion _4 Ankle eversion _5 (Blank rows = not tested)   GAIT: Slight decrease in stride length, no AD    TODAY'S TREATMENT: 07/02/22: Standing: Tennis ball bottom of foot rolls   Heel raises with ankle roll out/in 2x 10 Lunge onto BOSU upside down minimal hand use 15x each Step up  onto BOSU 1 HHA 15x Balance beam tandem and sidestep with intermittent HHA BOSU side lunges 10x (c/o crepitus Rt knee) Vector stance on foam 5x 5" BAPS L3 15x each direction (DF/PF, Inv/Ev, CW/CCW) Body craft leg press and plantar flexion 5Pl 10reps x 2 sets each.  06/24/22 Reassess FOTO 69%  AROM MMT  Lunges on BOSU no HHA x 15 each  Step ups on BOSU x10 each int HHA    7/21/223  Slant board stretch  Circle wobble board a/p, m/l x10 taps each with b/l UE support, in left sls  Tennis ball bottom of foot rolls x2 mins  Heel raises with ankle roll out/in x15  Foam sls 20 sec holds x3  Firm ground sls with contralateral LE yoga block flips x15  70# body craft heel raises b/l x20  Foam beam tandem walks x6 laps  Foam beam lateral step with cone tap outs standing on left LE x6 laps   Bosu lunge anterior with left LE, 5 sec hold x10 times  Inverted bosu static hold-> a/p shifts  Round side up slow and controlled single leg march with touch Ue support    All at [facility]         PATIENT EDUCATION:  Education details: on eval findings, POC and HEP  Person educated: Patient Education method: Explanation Education comprehension: verbalized understanding   HOME EXERCISE PROGRAM: Access Code: [email or web address] URL: [email or web address] Date: 05/28/2022 Prepared by: Josue Hector  Exercises - Long Sitting Ankle Eversion with Resistance  - 2-3 x daily - 7 x weekly - 3 sets - 10 reps - Gastroc Stretch on Wall  - 2-3 x daily - 7 x weekly - 1 sets - 3 reps - 30 second hold  ASSESSMENT:  CLINICAL IMPRESSION: Session focus with ankle stability and LE strengthening.  Pt able to tolerate well to session with no report of increased pain through sessoin.  Pt does present with decreased stability on dynamic surface with occasional UE support required for LOB.    OBJECTIVE IMPAIRMENTS Abnormal gait, decreased balance, decreased ROM, decreased strength, increased fascial restrictions,  impaired flexibility, improper body mechanics, and pain.   ACTIVITY LIMITATIONS standing, squatting, stairs, transfers, and locomotion level  PARTICIPATION LIMITATIONS: meal prep, cleaning, laundry, shopping, community activity, occupation, and yard work  PERSONAL FACTORS  none  are also affecting patient's functional outcome.   REHAB POTENTIAL: Good  CLINICAL DECISION MAKING: Stable/uncomplicated  EVALUATION COMPLEXITY: Low   GOALS: SHORT TERM GOALS: Target date: 07/16/2022  Patient will be independent with initial HEP and self-management strategies to improve functional outcomes Baseline:  Goal status: MET  LONG TERM GOALS: Target date: 07/30/2022  Patient will be independent with advanced HEP and self-management strategies to improve functional outcomes Baseline:  Goal status: IN PROGRESS  2.  Patient will improve FOTO score to predicted value to indicate improvement in functional outcomes Baseline: 69% function  Goal status: Partially MET  3.  Patient will report at least 75% overall improvement in subjective complaint to indicate improvement in ability to perform ADLs. Baseline: Reports 75%  Goal status: MET  4. Patient will have equal to or 5/5 MMT throughout BLE to improve ability to perform functional mobility, stair ambulation and ADLs.  Baseline: See MMT Goal status: partially MET    PLAN: PT FREQUENCY: 1x/week  PT DURATION: 4 weeks  PLANNED INTERVENTIONS: Therapeutic exercises, Therapeutic activity, Neuromuscular re-education, Balance training, Gait training, Patient/Family education, Joint manipulation, Joint mobilization, Stair training, Aquatic Therapy, Dry Needling, Electrical stimulation, Spinal manipulation, Spinal mobilization, Cryotherapy, Moist heat, scar mobilization, Taping, Traction, Ultrasound, Biofeedback, Ionotophoresis 63m/ml Dexamethasone, and Manual therapy.   PLAN FOR NEXT SESSION:  Progress ankle stability and strength. Hip abduction  strength and balance, stairs.  CIhor Austin LPTA/CLT; CBIS [phone] 10:27 AM, 07/02/22

## 2022-07-04 ENCOUNTER — Encounter (HOSPITAL_COMMUNITY): Payer: No Typology Code available for payment source | Admitting: Physical Therapy

## 2022-07-05 DIAGNOSIS — K219 Gastro-esophageal reflux disease without esophagitis: Secondary | ICD-10-CM | POA: Diagnosis not present

## 2022-07-09 ENCOUNTER — Ambulatory Visit (HOSPITAL_COMMUNITY): Payer: 59 | Admitting: Physical Therapy

## 2022-07-09 DIAGNOSIS — Z79899 Other long term (current) drug therapy: Secondary | ICD-10-CM | POA: Diagnosis not present

## 2022-07-09 DIAGNOSIS — K760 Fatty (change of) liver, not elsewhere classified: Secondary | ICD-10-CM | POA: Diagnosis not present

## 2022-07-09 DIAGNOSIS — Z9189 Other specified personal risk factors, not elsewhere classified: Secondary | ICD-10-CM | POA: Diagnosis not present

## 2022-07-09 DIAGNOSIS — M25572 Pain in left ankle and joints of left foot: Secondary | ICD-10-CM | POA: Diagnosis not present

## 2022-07-09 DIAGNOSIS — K219 Gastro-esophageal reflux disease without esophagitis: Secondary | ICD-10-CM | POA: Diagnosis not present

## 2022-07-09 DIAGNOSIS — R7301 Impaired fasting glucose: Secondary | ICD-10-CM | POA: Diagnosis not present

## 2022-07-09 DIAGNOSIS — E8881 Metabolic syndrome: Secondary | ICD-10-CM | POA: Diagnosis not present

## 2022-07-09 DIAGNOSIS — E538 Deficiency of other specified B group vitamins: Secondary | ICD-10-CM | POA: Diagnosis not present

## 2022-07-09 DIAGNOSIS — E8889 Other specified metabolic disorders: Secondary | ICD-10-CM | POA: Diagnosis not present

## 2022-07-09 DIAGNOSIS — E782 Mixed hyperlipidemia: Secondary | ICD-10-CM | POA: Diagnosis not present

## 2022-07-09 DIAGNOSIS — Z1331 Encounter for screening for depression: Secondary | ICD-10-CM | POA: Diagnosis not present

## 2022-07-09 NOTE — Therapy (Signed)
OUTPATIENT PHYSICAL THERAPY LOWER EXTREMITY TREATMENT   Patient Name: MARIELIZ STRANG MRN: [id number] DOB:December 01, 1956, 66 y.o., female Today's Date: 07/09/2022 PHYSICAL THERAPY DISCHARGE SUMMARY  Visits from Start of Care: 8  Current functional level related to goals / functional outcomes: All goals met   Remaining deficits: All goals met   Education / Equipment: HEP   Patient agrees to discharge. Patient goals were met. Patient is being discharged due to being pleased with the current functional level.     PT End of Session - 07/09/22 1412    Visit Number 8    Number of Visits 8    Date for PT Re-Evaluation 07/09/22    Authorization Type [employer]    PT Start Time 7829    PT Stop Time 1412    PT Time Calculation (min) 27 min    Activity Tolerance Patient tolerated treatment well    Behavior During Therapy WFL for tasks assessed/performed                Past Medical History:  Diagnosis Date   Anxiety    DUB (dysfunctional uterine bleeding)    GERD (gastroesophageal reflux disease)    Pelvic pain in female    Uterus, adenomyosis    Wears contact lenses    Past Surgical History:  Procedure Laterality Date   COLONOSCOPY     HYSTEROSCOPY WITH D & C  09/24/2012   Procedure: DILATATION AND CURETTAGE /HYSTEROSCOPY;  Surgeon: Maisie Fus, MD;  Location: [facility];  Service: Gynecology;  Laterality: N/A;   LAPAROSCOPIC ASSISTED VAGINAL HYSTERECTOMY N/A 11/01/2014   Procedure: LAPAROSCOPIC ASSISTED VAGINAL HYSTERECTOMY;  Surgeon: Maisie Fus, MD;  Location: [facility];  Service: Gynecology;  Laterality: N/A;   SALPINGOOPHORECTOMY Bilateral 11/01/2014   Procedure: SALPINGO OOPHORECTOMY;  Surgeon: Maisie Fus, MD;  Location: [facility];  Service: Gynecology;  Laterality: Bilateral;   Patient Active Problem List   Diagnosis Date Noted   Elevated coronary artery calcium score 11/05/2021   Family history of early CAD 09/09/2019    Essential hypertension 09/09/2019   Mixed hyperlipidemia 09/09/2019   S/P laparoscopic assisted vaginal hysterectomy (LAVH) 11/01/2014    PCP: Josetta Huddle MD  REFERRING PROVIDER: Wylene Simmer, MD   REFERRING DIAG: peroneal tendinitis (LT)  THERAPY DIAG:  Pain in left ankle and joints of left foot  Rationale for Evaluation and Treatment Rehabilitation  ONSET DATE: March 2023  SUBJECTIVE:   SUBJECTIVE STATEMENT: Pt states that she has not been consistent in completing her HEP.  Pt states that she has not had much pain in the past several weeks. She thinks that she might ready for discharge. Pt feels that she is 90% better.  She has not gotten back to walking again.    PERTINENT HISTORY: NA  PAIN:  Are you having pain? No   PRECAUTIONS: None  WEIGHT BEARING RESTRICTIONS No  FALLS:  Has patient fallen in last 6 months? Yes. 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)    PLOF: Independent  PATIENT GOALS Be more mobile, improve balance    OBJECTIVE:   DIAGNOSTIC FINDINGS: Xrays   PATIENT SURVEYS:  FOTO 69% function  ; 8/8:  foto:  78  COGNITION:  Overall cognitive status: Within functional limits for tasks assessed     SENSATION: [facility]  EDEMA:  Little to none noted   PALPATION: Minimal TTP about Lt lateral ankle   LOWER EXTREMITY ROM: Bilateral LE AROM WFL except moderate restriction in bilateral ankle DF ROM   Active ROM Right eval Left eval 07/09/22  Hip flexion     Hip extension     Hip abduction     Hip adduction     Hip internal rotation     Hip external rotation     Knee flexion     Knee extension     Ankle dorsiflexion _0 Ankle plantarflexion     Ankle inversion     Ankle eversion      (Blank rows = not tested)  LOWER EXTREMITY MMT:  MMT Right eval Left  eval Left   06/24/22 Left 06/28/22  Hip flexion _1 Hip extension _2 Hip abduction 4 4 4+ 5  Hip adduction      Hip internal rotation      Hip external rotation      Knee flexion      Knee extension _3 Ankle dorsiflexion _4 Ankle plantarflexion   5   Ankle inversion _5 Ankle eversion _6 (Blank rows = not tested)   GAIT: Slight decrease in stride length, no AD    TODAY'S TREATMENT: 07/09/2022 Foto 78 Heel raises x 15 Vector stance on foam x 5 Plantar fascia stretch x 3 Gastroc stretch x 3   07/02/22: Standing: Tennis ball bottom of foot rolls   Heel raises with ankle roll out/in 2x 10 Lunge onto BOSU upside down minimal hand use 15x each Step up onto BOSU 1 HHA 15x Balance beam tandem and sidestep with intermittent HHA BOSU side lunges 10x (c/o crepitus Rt knee) Vector stance on foam 5x 5" BAPS L3 15x each direction (DF/PF, Inv/Ev, CW/CCW) Body craft leg press and plantar flexion 5Pl 10reps x 2 sets each.  06/24/22 Reassess FOTO 69%  AROM MMT  Lunges on BOSU no HHA x 15 each  Step ups on BOSU x10 each int HHA    7/21/223  Slant board stretch  Circle wobble board a/p, m/l x10 taps each with b/l UE support, in left sls  Tennis ball bottom of foot rolls x2 mins  Heel raises with ankle roll out/in x15  Foam sls 20 sec holds x3  Firm ground sls with contralateral LE yoga block flips x15  70# body craft heel raises b/l x20  Foam beam tandem walks x6 laps  Foam beam lateral step with cone tap outs standing on left LE x6 laps   Bosu lunge anterior with left LE, 5 sec hold x10 times  Inverted bosu static hold-> a/p shifts  Round side up slow and controlled single leg march with touch Ue support    All at [facility]         PATIENT EDUCATION:  Education details: on eval findings, POC and HEP  Person educated: Patient Education method: Explanation Education comprehension: verbalized understanding   HOME EXERCISE PROGRAM: Access Code:  [email or web address] URL: [email or web address] Date: 05/28/2022 Prepared by: Josue Hector  Exercises - Long Sitting Ankle Eversion with Resistance  - 2-3 x daily - 7 x weekly - 3 sets - 10 reps - Gastroc Stretch on Wall  - 2-3 x daily - 7 x weekly - 1 sets - 3 reps - 30 second hold  ASSESSMENT:  CLINICAL IMPRESSION: PT reassessed with all goals met.  Pt feels she is ready for discharge.  Therapist reviewed the Importance of completing stretches as well as balance activities.  OBJECTIVE IMPAIRMENTS Abnormal gait, decreased balance, decreased ROM, decreased strength, increased fascial restrictions, impaired flexibility, improper body mechanics, and pain.   ACTIVITY LIMITATIONS standing, squatting, stairs, transfers, and locomotion level  PARTICIPATION LIMITATIONS: meal prep, cleaning, laundry, shopping, community activity, occupation, and yard work  PERSONAL FACTORS  none  are also affecting patient's functional outcome.   REHAB POTENTIAL: Good  CLINICAL DECISION MAKING: Stable/uncomplicated  EVALUATION COMPLEXITY: Low   GOALS: SHORT TERM GOALS: Target date: 07/23/2022  Patient will be independent with initial HEP and self-management strategies to improve functional outcomes Baseline:  Goal status: MET   LONG TERM GOALS: Target date: 08/06/2022  Patient will be independent with advanced HEP and self-management strategies to improve functional outcomes Baseline:  Goal status: MET  2.  Patient will improve FOTO score to predicted value to indicate improvement in functional outcomes Baseline: 69% function  Goal status: Partially MET  3.  Patient will report at least 75% overall improvement in subjective complaint to indicate improvement in ability to perform ADLs. Baseline: Reports 75%  Goal status: MET  4. Patient will have equal to or 5/5 MMT throughout BLE to improve ability to perform functional mobility, stair ambulation and ADLs.  Baseline: See MMT Goal status:  partially MET    PLAN: PT FREQUENCY: 1x/week  PT DURATION: 4 weeks  PLANNED INTERVENTIONS: Therapeutic exercises, Therapeutic activity, Neuromuscular re-education, Balance training, Gait training, Patient/Family education, Joint manipulation, Joint mobilization, Stair training, Aquatic Therapy, Dry Needling, Electrical stimulation, Spinal manipulation, Spinal mobilization, Cryotherapy, Moist heat, scar mobilization, Taping, Traction, Ultrasound, Biofeedback, Ionotophoresis 74m/ml Dexamethasone, and Manual therapy.   PLAN FOR NEXT SESSION: Discharge from therapy CRayetta Humphrey PT CLT [phone] 2:22PM, 07/09/22

## 2022-07-12 ENCOUNTER — Other Ambulatory Visit (HOSPITAL_COMMUNITY): Payer: Self-pay

## 2022-07-16 ENCOUNTER — Encounter (HOSPITAL_COMMUNITY): Payer: No Typology Code available for payment source | Admitting: Physical Therapy

## 2022-07-23 ENCOUNTER — Encounter (HOSPITAL_COMMUNITY): Payer: No Typology Code available for payment source

## 2022-07-29 DIAGNOSIS — E782 Mixed hyperlipidemia: Secondary | ICD-10-CM | POA: Diagnosis not present

## 2022-07-29 DIAGNOSIS — K219 Gastro-esophageal reflux disease without esophagitis: Secondary | ICD-10-CM | POA: Diagnosis not present

## 2022-07-29 DIAGNOSIS — F418 Other specified anxiety disorders: Secondary | ICD-10-CM | POA: Diagnosis not present

## 2022-07-29 DIAGNOSIS — K760 Fatty (change of) liver, not elsewhere classified: Secondary | ICD-10-CM | POA: Diagnosis not present

## 2022-07-29 DIAGNOSIS — Z9189 Other specified personal risk factors, not elsewhere classified: Secondary | ICD-10-CM | POA: Diagnosis not present

## 2022-07-29 DIAGNOSIS — M791 Myalgia, unspecified site: Secondary | ICD-10-CM | POA: Diagnosis not present

## 2022-07-29 DIAGNOSIS — E8881 Metabolic syndrome: Secondary | ICD-10-CM | POA: Diagnosis not present

## 2022-07-29 DIAGNOSIS — E559 Vitamin D deficiency, unspecified: Secondary | ICD-10-CM | POA: Diagnosis not present

## 2022-07-29 DIAGNOSIS — E538 Deficiency of other specified B group vitamins: Secondary | ICD-10-CM | POA: Diagnosis not present

## 2022-07-29 DIAGNOSIS — I10 Essential (primary) hypertension: Secondary | ICD-10-CM | POA: Diagnosis not present

## 2022-08-02 ENCOUNTER — Ambulatory Visit: Payer: Self-pay | Admitting: Cardiology

## 2022-08-02 ENCOUNTER — Other Ambulatory Visit (HOSPITAL_COMMUNITY): Payer: Self-pay

## 2022-08-02 MED ORDER — OZEMPIC (0.25 OR 0.5 MG/DOSE) 2 MG/3ML ~~LOC~~ SOPN
PEN_INJECTOR | SUBCUTANEOUS | 0 refills | Status: DC
  Filled 2022-08-02: qty 3, 30d supply, fill #0

## 2022-08-07 ENCOUNTER — Other Ambulatory Visit (HOSPITAL_COMMUNITY): Payer: Self-pay

## 2022-08-20 ENCOUNTER — Other Ambulatory Visit (HOSPITAL_COMMUNITY): Payer: Self-pay

## 2022-08-20 MED ORDER — BUPROPION HCL ER (XL) 300 MG PO TB24
300.0000 mg | ORAL_TABLET | Freq: Every morning | ORAL | 3 refills | Status: AC
  Filled 2022-08-20: qty 90, 90d supply, fill #0
  Filled 2022-11-12: qty 90, 90d supply, fill #1

## 2022-08-21 ENCOUNTER — Other Ambulatory Visit (HOSPITAL_COMMUNITY): Payer: Self-pay

## 2022-08-22 ENCOUNTER — Other Ambulatory Visit (HOSPITAL_COMMUNITY): Payer: Self-pay

## 2022-08-22 ENCOUNTER — Ambulatory Visit: Payer: 59 | Admitting: Cardiology

## 2022-08-22 ENCOUNTER — Encounter: Payer: Self-pay | Admitting: Cardiology

## 2022-08-22 VITALS — BP 142/63 | HR 72 | Temp 97.8°F | Resp 16 | Ht 68.0 in | Wt 221.0 lb

## 2022-08-22 DIAGNOSIS — E8881 Metabolic syndrome: Secondary | ICD-10-CM | POA: Diagnosis not present

## 2022-08-22 DIAGNOSIS — R7301 Impaired fasting glucose: Secondary | ICD-10-CM | POA: Diagnosis not present

## 2022-08-22 DIAGNOSIS — E538 Deficiency of other specified B group vitamins: Secondary | ICD-10-CM | POA: Diagnosis not present

## 2022-08-22 DIAGNOSIS — E785 Hyperlipidemia, unspecified: Secondary | ICD-10-CM | POA: Diagnosis not present

## 2022-08-22 DIAGNOSIS — R931 Abnormal findings on diagnostic imaging of heart and coronary circulation: Secondary | ICD-10-CM | POA: Diagnosis not present

## 2022-08-22 DIAGNOSIS — I10 Essential (primary) hypertension: Secondary | ICD-10-CM | POA: Diagnosis not present

## 2022-08-22 DIAGNOSIS — M25572 Pain in left ankle and joints of left foot: Secondary | ICD-10-CM | POA: Diagnosis not present

## 2022-08-22 DIAGNOSIS — F418 Other specified anxiety disorders: Secondary | ICD-10-CM | POA: Diagnosis not present

## 2022-08-22 DIAGNOSIS — Z9189 Other specified personal risk factors, not elsewhere classified: Secondary | ICD-10-CM | POA: Diagnosis not present

## 2022-08-22 DIAGNOSIS — E782 Mixed hyperlipidemia: Secondary | ICD-10-CM

## 2022-08-22 MED ORDER — EZETIMIBE 10 MG PO TABS
10.0000 mg | ORAL_TABLET | Freq: Every day | ORAL | 3 refills | Status: DC
  Filled 2022-08-22: qty 30, 30d supply, fill #0
  Filled 2022-09-16: qty 30, 30d supply, fill #1
  Filled 2022-10-11: qty 30, 30d supply, fill #2

## 2022-08-22 MED ORDER — METFORMIN HCL 500 MG PO TABS
500.0000 mg | ORAL_TABLET | Freq: Every day | ORAL | 0 refills | Status: DC
  Filled 2022-08-22: qty 30, 30d supply, fill #0

## 2022-08-22 NOTE — Progress Notes (Signed)
Patient referred by Josetta Huddle, MD for hyperlipidemia  Subjective:   Kathleen Wise, female    DOB: July 01, 1956, 66 y.o.   MRN: [id number]   Chief Complaint  Patient presents with   Hypertension   Hyperlipidemia   Follow-up    6 month    HPI  66 y.o. Caucasian female with hypertension, hyperlipidemia, mildly elevated coronary calcium score/  Patient is doing well, denies chest pain, shortness of breath, palpitations, leg edema, orthopnea, PND, TIA/syncope. Blood pressure elevated today, always normal at home. In the past, she has verified home BP readings with PCP monitor, and they have mathed.    Current Outpatient Medications:    Azelaic Acid 15 % gel, Apply topically to entire face twice a day as directed., Disp: 50 g, Rfl: 2   benzonatate (TESSALON) 100 MG capsule, Take1 capsule by mouth three times daily as needed for cough., Disp: 42 capsule, Rfl: 0   betamethasone dipropionate 0.05 % lotion, APPLY TOPICALLY ONTO THE SKIN FOR 6 WEEKS, Disp: 60 mL, Rfl: 1   buPROPion (WELLBUTRIN XL) 300 MG 24 hr tablet, TAKE 1 TABLET BY MOUTH EVERY MORNING, Disp: 90 tablet, Rfl: 3   buPROPion (WELLBUTRIN XL) 300 MG 24 hr tablet, TAKE 1 TABLET BY MOUTH EVERY MORNING, Disp: 90 tablet, Rfl: 3   cetirizine (ZYRTEC) 10 MG tablet, Take 10 mg by mouth daily as needed., Disp: , Rfl:    cholecalciferol (VITAMIN D3) 25 MCG (1000 UT) tablet, Take 1,000 Units by mouth daily., Disp: , Rfl:    ciprofloxacin (CIPRO) 500 MG tablet, Take 1 tablet (500 mg total) by mouth every 12 (twelve) hours., Disp: 14 tablet, Rfl: 0   Coenzyme Q10 (COQ10) 200 MG CAPS, Take 1 tablet by mouth daily at 6 (six) AM., Disp: , Rfl:    COVID-19 mRNA Vac-TriS, Pfizer, (PFIZER-BIONT COVID-19 VAC-TRIS) SUSP injection, Inject into the muscle., Disp: 0.3 mL, Rfl: 0   cyanocobalamin (VITAMIN B12) 1000 MCG tablet, Take 1,000 mcg by mouth daily., Disp: , Rfl:    diclofenac Sodium (VOLTAREN) 1 % GEL, APPLY 2 GRAMS TO THE AFFECTED  AREA(S) 4 TIMES PER DAY, Disp: 100 g, Rfl: 1   doxycycline (ADOXA) 100 MG tablet, Take 1 tablet by mouth once a day, Disp: 30 tablet, Rfl: 12   estradiol (VIVELLE-DOT) 0.1 MG/24HR patch, Place 1 patch onto the skin 2 (two) times a week. Switch out on Every Sunday and Thursday,, Disp: , Rfl:    estradiol (VIVELLE-DOT) 0.1 MG/24HR patch, APPLY 1 PATCH 2 TIMES A WEEK., Disp: 24 patch, Rfl: 3   fluticasone (FLONASE) 50 MCG/ACT nasal spray, Place into both nostrils daily., Disp: , Rfl:    hydrocortisone (ANUSOL-HC) 25 MG suppository, Unwrap and insert 1 suppository rectally once a day for 5 to 7 days as directed, Disp: 7 suppository, Rfl: 1   hydrocortisone (ANUSOL-HC) 25 MG suppository, Unwrap and insert 1 suppository rectally 2 times a day, Disp: 14 suppository, Rfl: 0   hydrocortisone (ANUSOL-HC) 25 MG suppository, Insert 1 suppository rectally once a day at bedtime for 10 days., Disp: 10 suppository, Rfl: 1   pantoprazole (PROTONIX) 40 MG tablet, TAKE 1 TABLET BY MOUTH 3 TIMES WEEKLY (Patient taking differently: Take 20 mg by mouth daily.), Disp: 12 tablet, Rfl: 10   pantoprazole (PROTONIX) 40 MG tablet, Take 1 tablet by mouth twice a day, Disp: 180 tablet, Rfl: 3   rosuvastatin (CRESTOR) 20 MG tablet, TAKE 1 TABLET BY MOUTH ONCE A DAY, Disp: 90 tablet,  Rfl: 3   Sodium Sulfate-Mag Sulfate-KCl (SUTAB) [phone] MG TABS, Use as directed, Disp: 24 tablet, Rfl: 0   traZODone (DESYREL) 50 MG tablet, TAKE 1-2 TABLETS BY MOUTH ONCE DAILY AT BEDTIME AS NEEDED FOR SLEEP, Disp: 180 tablet, Rfl: 0   traZODone (DESYREL) 50 MG tablet, Take 1 to 2 tablets by mouth once at bedtime as needed for sleep, Disp: 180 tablet, Rfl: 1   valsartan-hydrochlorothiazide (DIOVAN-HCT) 80-12.5 MG tablet, Take 1 tablet by mouth daily., Disp: , Rfl:    valsartan-hydrochlorothiazide (DIOVAN-HCT) 80-12.5 MG tablet, Take 1 tablet by mouth daily., Disp: 90 tablet, Rfl: 3  Cardiovascular studies:  EKG 08/22/2022: Sinus rhythm 74  bpm Normal EKG  CT cardiac scoring 08/02/2021: 1. Coronary calcium score of 12 (all in Lcx) is at the 65th percentile for the patient's age, sex and race. 2. Calcified granuloma of the right lower lobe is consistent with prior granulomatous disease. 3. Nonspecific irregular ground-glass opacities in the lower lobes and lingula which have the appearance of scarring. This may relate to prior infection, inflammation or chronic disease. Correlation suggested with any history of pulmonary disease or infection.   Recent labs: 05/06/2022: Chol 177, TG 147, HDL 64, LDL 88  06/28/2021: Glucose 102, BUN/Cr 11/0.8. EGFR 83. Na/K 139/4.7.  Chol 193, TG 170, HDL 60, LDL 104    Review of Systems  Cardiovascular:  Negative for chest pain, dyspnea on exertion, leg swelling, palpitations and syncope.  Musculoskeletal:  Positive for myalgias.       Vitals:   08/22/22 1102  BP: (!) 155/70  Pulse: 75  Resp: 16  Temp: 97.8 F (36.6 C)  SpO2: 96%     Body mass index is 33.6 kg/m. Filed Weights   08/22/22 1102  Weight: 221 lb (100.2 kg)     Objective:   Physical Exam Vitals and nursing note reviewed.  Constitutional:      General: She is not in acute distress. Neck:     Vascular: No JVD.  Cardiovascular:     Rate and Rhythm: Normal rate and regular rhythm.     Heart sounds: Normal heart sounds. No murmur heard. Pulmonary:     Effort: Pulmonary effort is normal.     Breath sounds: Normal breath sounds. No wheezing or rales.  Musculoskeletal:     Right lower leg: No edema.     Left lower leg: No edema.           Assessment & Recommendations:   66 y.o. Caucasian female with hypertension, hyperlipidemia.  Mixed hyperlipidemia: LDL 88, HDL 64 (05/2022) Calcium score 12 (08/2021)  Currently on Crestor 40 mg daily.   Added Zetia 10 mg daily. Repeat lipid panel in 3 months  Hypertension: Suspect she has a component of white coat hypertension. No change made today.   F/u  in 3 months     Nigel Mormon, MD Pager: [phone] Office: [phone]

## 2022-08-23 ENCOUNTER — Other Ambulatory Visit (HOSPITAL_COMMUNITY): Payer: Self-pay

## 2022-09-02 DIAGNOSIS — H5213 Myopia, bilateral: Secondary | ICD-10-CM | POA: Diagnosis not present

## 2022-09-02 DIAGNOSIS — H40013 Open angle with borderline findings, low risk, bilateral: Secondary | ICD-10-CM | POA: Diagnosis not present

## 2022-09-11 ENCOUNTER — Other Ambulatory Visit (HOSPITAL_COMMUNITY): Payer: Self-pay

## 2022-09-12 ENCOUNTER — Other Ambulatory Visit (HOSPITAL_COMMUNITY): Payer: Self-pay

## 2022-09-12 DIAGNOSIS — M25572 Pain in left ankle and joints of left foot: Secondary | ICD-10-CM | POA: Diagnosis not present

## 2022-09-12 DIAGNOSIS — E7849 Other hyperlipidemia: Secondary | ICD-10-CM | POA: Diagnosis not present

## 2022-09-12 DIAGNOSIS — E538 Deficiency of other specified B group vitamins: Secondary | ICD-10-CM | POA: Diagnosis not present

## 2022-09-12 DIAGNOSIS — E669 Obesity, unspecified: Secondary | ICD-10-CM | POA: Diagnosis not present

## 2022-09-12 DIAGNOSIS — I10 Essential (primary) hypertension: Secondary | ICD-10-CM | POA: Diagnosis not present

## 2022-09-12 DIAGNOSIS — F418 Other specified anxiety disorders: Secondary | ICD-10-CM | POA: Diagnosis not present

## 2022-09-12 DIAGNOSIS — E785 Hyperlipidemia, unspecified: Secondary | ICD-10-CM | POA: Diagnosis not present

## 2022-09-12 DIAGNOSIS — R7301 Impaired fasting glucose: Secondary | ICD-10-CM | POA: Diagnosis not present

## 2022-09-12 MED ORDER — OZEMPIC (0.25 OR 0.5 MG/DOSE) 2 MG/3ML ~~LOC~~ SOPN
0.2500 mg | PEN_INJECTOR | SUBCUTANEOUS | 0 refills | Status: AC
  Filled 2022-09-12: qty 3, 28d supply, fill #0

## 2022-09-16 ENCOUNTER — Other Ambulatory Visit (HOSPITAL_COMMUNITY): Payer: Self-pay

## 2022-09-18 ENCOUNTER — Other Ambulatory Visit (HOSPITAL_COMMUNITY): Payer: Self-pay

## 2022-09-23 ENCOUNTER — Other Ambulatory Visit (HOSPITAL_COMMUNITY): Payer: Self-pay

## 2022-10-02 ENCOUNTER — Other Ambulatory Visit (HOSPITAL_COMMUNITY): Payer: Self-pay

## 2022-10-03 ENCOUNTER — Other Ambulatory Visit (HOSPITAL_COMMUNITY): Payer: Self-pay

## 2022-10-03 MED ORDER — VALSARTAN-HYDROCHLOROTHIAZIDE 80-12.5 MG PO TABS
1.0000 | ORAL_TABLET | Freq: Every day | ORAL | 3 refills | Status: DC
  Filled 2022-10-03: qty 90, 90d supply, fill #0
  Filled 2022-11-12: qty 90, 90d supply, fill #1

## 2022-10-10 ENCOUNTER — Other Ambulatory Visit (HOSPITAL_COMMUNITY): Payer: Self-pay

## 2022-10-11 ENCOUNTER — Other Ambulatory Visit (HOSPITAL_COMMUNITY): Payer: Self-pay

## 2022-10-15 ENCOUNTER — Other Ambulatory Visit (HOSPITAL_COMMUNITY): Payer: Self-pay

## 2022-10-23 ENCOUNTER — Other Ambulatory Visit (HOSPITAL_COMMUNITY): Payer: Self-pay

## 2022-10-29 DIAGNOSIS — M7051 Other bursitis of knee, right knee: Secondary | ICD-10-CM | POA: Diagnosis not present

## 2022-10-29 DIAGNOSIS — M7631 Iliotibial band syndrome, right leg: Secondary | ICD-10-CM | POA: Diagnosis not present

## 2022-10-29 DIAGNOSIS — M25561 Pain in right knee: Secondary | ICD-10-CM | POA: Diagnosis not present

## 2022-10-29 DIAGNOSIS — M1711 Unilateral primary osteoarthritis, right knee: Secondary | ICD-10-CM | POA: Diagnosis not present

## 2022-10-30 ENCOUNTER — Other Ambulatory Visit (HOSPITAL_COMMUNITY): Payer: Self-pay

## 2022-10-30 DIAGNOSIS — M545 Low back pain, unspecified: Secondary | ICD-10-CM | POA: Diagnosis not present

## 2022-10-30 DIAGNOSIS — S39012A Strain of muscle, fascia and tendon of lower back, initial encounter: Secondary | ICD-10-CM | POA: Diagnosis not present

## 2022-10-30 MED ORDER — CYCLOBENZAPRINE HCL 7.5 MG PO TABS
7.5000 mg | ORAL_TABLET | Freq: Every evening | ORAL | 0 refills | Status: DC
  Filled 2022-10-30: qty 10, 10d supply, fill #0

## 2022-10-30 MED ORDER — MELOXICAM 15 MG PO TABS
15.0000 mg | ORAL_TABLET | Freq: Every day | ORAL | 0 refills | Status: DC
  Filled 2022-10-30: qty 30, 30d supply, fill #0

## 2022-10-31 ENCOUNTER — Other Ambulatory Visit (HOSPITAL_COMMUNITY): Payer: Self-pay | Admitting: Family Medicine

## 2022-10-31 ENCOUNTER — Other Ambulatory Visit (HOSPITAL_COMMUNITY): Payer: Self-pay

## 2022-10-31 DIAGNOSIS — E782 Mixed hyperlipidemia: Secondary | ICD-10-CM | POA: Diagnosis not present

## 2022-10-31 DIAGNOSIS — R7301 Impaired fasting glucose: Secondary | ICD-10-CM | POA: Diagnosis not present

## 2022-10-31 DIAGNOSIS — M25572 Pain in left ankle and joints of left foot: Secondary | ICD-10-CM | POA: Diagnosis not present

## 2022-10-31 DIAGNOSIS — E538 Deficiency of other specified B group vitamins: Secondary | ICD-10-CM | POA: Diagnosis not present

## 2022-10-31 DIAGNOSIS — R931 Abnormal findings on diagnostic imaging of heart and coronary circulation: Secondary | ICD-10-CM | POA: Diagnosis not present

## 2022-10-31 DIAGNOSIS — E669 Obesity, unspecified: Secondary | ICD-10-CM | POA: Diagnosis not present

## 2022-10-31 DIAGNOSIS — I10 Essential (primary) hypertension: Secondary | ICD-10-CM | POA: Diagnosis not present

## 2022-10-31 DIAGNOSIS — Z6830 Body mass index (BMI) 30.0-30.9, adult: Secondary | ICD-10-CM | POA: Diagnosis not present

## 2022-11-01 ENCOUNTER — Other Ambulatory Visit (HOSPITAL_COMMUNITY): Payer: Self-pay

## 2022-11-01 LAB — LIPID PANEL
Chol/HDL Ratio: 2.6 ratio (ref 0.0–4.4)
Cholesterol, Total: 177 mg/dL (ref 100–199)
HDL: 69 mg/dL (ref >39)
LDL Chol Calc (NIH): 83 mg/dL (ref 0–99)
Triglycerides: 150 mg/dL — ABNORMAL HIGH (ref 0–149)
VLDL Cholesterol Cal: 25 mg/dL (ref 5–40)

## 2022-11-01 MED ORDER — CYCLOBENZAPRINE HCL 5 MG PO TABS
5.0000 mg | ORAL_TABLET | Freq: Every day | ORAL | 0 refills | Status: DC | PRN
  Filled 2022-11-01: qty 10, 10d supply, fill #0

## 2022-11-05 ENCOUNTER — Other Ambulatory Visit (HOSPITAL_COMMUNITY): Payer: Self-pay

## 2022-11-07 ENCOUNTER — Ambulatory Visit (HOSPITAL_COMMUNITY): Payer: 59 | Attending: Family Medicine | Admitting: Physical Therapy

## 2022-11-07 DIAGNOSIS — M5459 Other low back pain: Secondary | ICD-10-CM | POA: Insufficient documentation

## 2022-11-07 DIAGNOSIS — M25572 Pain in left ankle and joints of left foot: Secondary | ICD-10-CM | POA: Diagnosis not present

## 2022-11-07 NOTE — Therapy (Signed)
OUTPATIENT PHYSICAL THERAPY THORACOLUMBAR EVALUATION   Patient Name: Kathleen Wise MRN: [id number] DOB:01/27/1956, 66 y.o., female Today's Date: 11/07/2022  END OF SESSION:  PT End of Session - 11/07/22 0852     Visit Number 1    Number of Visits 8    Date for PT Re-Evaluation 12/05/22    Authorization Type [facility]    PT Start Time 0815    PT Stop Time 0854    PT Time Calculation (min) 39 min    Activity Tolerance Patient tolerated treatment well    Behavior During Therapy [facility] for tasks assessed/performed             Past Medical History:  Diagnosis Date   Anxiety    DUB (dysfunctional uterine bleeding)    GERD (gastroesophageal reflux disease)    Pelvic pain in female    Uterus, adenomyosis    Wears contact lenses    Past Surgical History:  Procedure Laterality Date   COLONOSCOPY     HYSTEROSCOPY WITH D & C  09/24/2012   Procedure: DILATATION AND CURETTAGE /HYSTEROSCOPY;  Surgeon: Maisie Fus, MD;  Location: [facility];  Service: Gynecology;  Laterality: N/A;   LAPAROSCOPIC ASSISTED VAGINAL HYSTERECTOMY N/A 11/01/2014   Procedure: LAPAROSCOPIC ASSISTED VAGINAL HYSTERECTOMY;  Surgeon: Maisie Fus, MD;  Location: [facility];  Service: Gynecology;  Laterality: N/A;   SALPINGOOPHORECTOMY Bilateral 11/01/2014   Procedure: SALPINGO OOPHORECTOMY;  Surgeon: Maisie Fus, MD;  Location: [facility];  Service: Gynecology;  Laterality: Bilateral;   Patient Active Problem List   Diagnosis Date Noted   Elevated coronary artery calcium score 11/05/2021   Family history of early CAD 09/09/2019   Essential hypertension 09/09/2019   Mixed hyperlipidemia 09/09/2019   S/P laparoscopic assisted vaginal hysterectomy (LAVH) 11/01/2014    PCP: Josetta Huddle MD  REFERRING PROVIDER: Lovenia Kim, MD  REFERRING DIAG: low back strain  Rationale for Evaluation and Treatment: Rehabilitation  THERAPY DIAG:  Other low back pain  ONSET DATE:  10/08/22  SUBJECTIVE:                                                                                                                                                                                           SUBJECTIVE STATEMENT: Patient presents to therapy with complaint of LBP. This began about a month ago. She reports a slip in her garage about 2 weeks prior to onset of back pain. She has had 4 episodes of back muscle spasms since this time. She has been prescribed flexeril and mobic which has helped.   PERTINENT HISTORY:  Arthritis, HTN  PAIN:  Are you having pain? Yes: NPRS scale: 3/10 Pain location: low back  Pain description: tight, weak  Aggravating factors: bending, lifting Relieving factors: rest, meds   PRECAUTIONS: None  WEIGHT BEARING RESTRICTIONS: No  FALLS:  Has patient fallen in last 6 months? Yes. Number of falls 1  LIVING ENVIRONMENT: Lives with: lives with their family and lives with their spouse Lives in: House/apartment Stairs: Yes: External: 2 steps; none Has following equipment at home: None  OCCUPATION: Nurse  PLOF: Independent  PATIENT GOALS: Have my back back   NEXT MD VISIT:   OBJECTIVE:   DIAGNOSTIC FINDINGS:  NA  PATIENT SURVEYS:  FOTO 42% function   COGNITION: Overall cognitive status: Within functional limits for tasks assessed     SENSATION: WFL  PALPATION: Mod TTP about lower bilateral lumbar paraspinals   LUMBAR ROM:   AROM eval  Flexion 75% limited  Extension 75% limited  Right lateral flexion 50% limited  Left lateral flexion 50% limited  Right rotation   Left rotation    (Blank rows = not tested)   LOWER EXTREMITY MMT:    MMT Right eval Left eval  Hip flexion 4+ 5  Hip extension 3+P! 3+  Hip abduction 4- 4  Hip adduction    Hip internal rotation    Hip external rotation    Knee flexion    Knee extension 4+ 5  Ankle dorsiflexion 5 5  Ankle plantarflexion    Ankle inversion    Ankle eversion      (Blank rows = not tested)  LUMBAR SPECIAL TESTS:  (-) SLR (+) sacral compression   FUNCTIONAL TESTS:  5 times sit to stand: 21 sec no hands (increased back pain)   GAIT:  TODAY'S TREATMENT:                                                                                                                              DATE:  11/07/22 Eval    PATIENT EDUCATION:  Education details: on Eval findings, POC and HEP  Person educated: Patient Education method: Explanation Education comprehension: verbalized understanding  HOME EXERCISE PROGRAM: Access Code: [email or web address] URL: [email or web address] Date: 11/07/2022 Prepared by: Josue Hector  Exercises - Supine Lower Trunk Rotation  - 2 x daily - 7 x weekly - 1 sets - 10 reps - 5-10 second hold - Hooklying Single Knee to Chest Stretch  - 2 x daily - 7 x weekly - 1 sets - 10 reps - 5-10 second hold - Supine Transversus Abdominis Bracing - Hands on Stomach  - 2 x daily - 7 x weekly - 1-2 sets - 10 reps - 5 second hold - Hooklying Gluteal Sets  - 2 x daily - 7 x weekly - 1-2 sets - 10 reps - 5 second hold  ASSESSMENT:  CLINICAL IMPRESSION: Patient is a 66 y.o. female who presents to physical therapy with complaint of LBP. Patient demonstrates muscle weakness, reduced ROM, and  fascial restrictions which are likely contributing to symptoms of pain and are negatively impacting patient ability to perform ADLs and functional mobility tasks. Patient will benefit from skilled physical therapy services to address these deficits to reduce pain and improve level of function with ADLs and functional mobility tasks.   OBJECTIVE IMPAIRMENTS: Abnormal gait, decreased activity tolerance, decreased mobility, difficulty walking, decreased ROM, decreased strength, increased fascial restrictions, increased muscle spasms, improper body mechanics, and pain.   ACTIVITY LIMITATIONS: carrying, lifting, bending, sitting, standing, squatting, sleeping,  stairs, transfers, bed mobility, and locomotion level  PARTICIPATION LIMITATIONS: meal prep, cleaning, laundry, driving, shopping, community activity, occupation, and yard work  PERSONAL FACTORS:  NA  are also affecting patient's functional outcome.   REHAB POTENTIAL: Good  CLINICAL DECISION MAKING: Stable/uncomplicated  EVALUATION COMPLEXITY: Low   GOALS: SHORT TERM GOALS: Target date: 11/28/2022  Patient will be independent with initial HEP and self-management strategies to improve functional outcomes Baseline:  Goal status: INITIAL   LONG TERM GOALS: Target date: 12/19/2022  Patient will be independent with advanced HEP and self-management strategies to improve functional outcomes Baseline:  Goal status: INITIAL  2.  Patient will improve FOTO score to predicted value to indicate improvement in functional outcomes Baseline: 42% function  Goal status: INITIAL  3.  Patient will report reduction of back pain to 0/10 at rest for improved quality of life and ability to perform ADLs  Baseline: 3/10 Goal status: INITIAL  4. Patient will have equal to or > 4+/5 MMT throughout BLE to improve ability to perform functional mobility, stair ambulation and ADLs.  Baseline: See MMT Goal status: INITIAL   PLAN:  PT FREQUENCY: 2x/week  PT DURATION: 4 weeks  PLANNED INTERVENTIONS: Therapeutic exercises, Therapeutic activity, Neuromuscular re-education, Balance training, Gait training, Patient/Family education, Joint manipulation, Joint mobilization, Stair training, Aquatic Therapy, Dry Needling, Electrical stimulation, Spinal manipulation, Spinal mobilization, Cryotherapy, Moist heat, scar mobilization, Taping, Traction, Ultrasound, Biofeedback, Ionotophoresis '4mg'$ /ml Dexamethasone, and Manual therapy. Marland Kitchen  PLAN FOR NEXT SESSION: Progress pain free lumbar ROM. Core and glute strengthening as tolerated.   8:53 AM, 11/07/22 Josue Hector PT DPT  Physical Therapist with [facility]  [phone]

## 2022-11-11 ENCOUNTER — Other Ambulatory Visit (HOSPITAL_COMMUNITY): Payer: Self-pay

## 2022-11-11 ENCOUNTER — Ambulatory Visit: Payer: 59 | Admitting: Cardiology

## 2022-11-11 ENCOUNTER — Encounter: Payer: Self-pay | Admitting: Cardiology

## 2022-11-11 VITALS — BP 145/80 | HR 66 | Ht 68.0 in | Wt 222.4 lb

## 2022-11-11 DIAGNOSIS — R931 Abnormal findings on diagnostic imaging of heart and coronary circulation: Secondary | ICD-10-CM | POA: Diagnosis not present

## 2022-11-11 DIAGNOSIS — I10 Essential (primary) hypertension: Secondary | ICD-10-CM | POA: Diagnosis not present

## 2022-11-11 DIAGNOSIS — E782 Mixed hyperlipidemia: Secondary | ICD-10-CM

## 2022-11-11 MED ORDER — EZETIMIBE 10 MG PO TABS
10.0000 mg | ORAL_TABLET | Freq: Every day | ORAL | 3 refills | Status: DC
  Filled 2022-11-11: qty 90, 90d supply, fill #0

## 2022-11-11 MED ORDER — ROSUVASTATIN CALCIUM 20 MG PO TABS
ORAL_TABLET | Freq: Every day | ORAL | 3 refills | Status: DC
  Filled 2022-11-11: qty 90, 90d supply, fill #0

## 2022-11-11 NOTE — Progress Notes (Signed)
Patient referred by Josetta Huddle, MD for hyperlipidemia  Subjective:   Kathleen Wise, female    DOB: 13-Jun-1956, 66 y.o.   MRN: [id number]   Chief Complaint  Patient presents with   mixed hyperlipidemia   Follow-up   Results    HPI  66 y.o. Caucasian female with hypertension, hyperlipidemia, mildly elevated coronary calcium score/  Patient is doing well, denies chest pain, shortness of breath, palpitations, leg edema, orthopnea, PND, TIA/syncope. As before, blood pressure elevated today, always normal at home. In the past, she has verified home BP readings with PCP monitor, and they have matched. Reviewed recent test results with the patient, details below.     Current Outpatient Medications:    Azelaic Acid 15 % gel, Apply topically to entire face twice a day as directed., Disp: 50 g, Rfl: 2   benzonatate (TESSALON) 100 MG capsule, Take1 capsule by mouth three times daily as needed for cough., Disp: 42 capsule, Rfl: 0   buPROPion (WELLBUTRIN XL) 300 MG 24 hr tablet, TAKE 1 TABLET BY MOUTH EVERY MORNING, Disp: 90 tablet, Rfl: 3   cetirizine (ZYRTEC) 10 MG tablet, Take 10 mg by mouth daily as needed., Disp: , Rfl:    cholecalciferol (VITAMIN D3) 25 MCG (1000 UT) tablet, Take 1,000 Units by mouth daily., Disp: , Rfl:    Coenzyme Q10 (COQ10) 200 MG CAPS, Take 1 tablet by mouth daily at 6 (six) AM., Disp: , Rfl:    cyanocobalamin (VITAMIN B12) 1000 MCG tablet, Take 1,000 mcg by mouth daily., Disp: , Rfl:    doxycycline (ADOXA) 100 MG tablet, Take 1 tablet by mouth once a day, Disp: 30 tablet, Rfl: 12   estradiol (VIVELLE-DOT) 0.1 MG/24HR patch, Place 1 patch onto the skin 2 (two) times a week. Switch out on Every Sunday and Thursday,, Disp: , Rfl:    ezetimibe (ZETIA) 10 MG tablet, Take 1 tablet (10 mg total) by mouth daily., Disp: 30 tablet, Rfl: 3   fluticasone (FLONASE) 50 MCG/ACT nasal spray, Place into both nostrils daily., Disp: , Rfl:    pantoprazole (PROTONIX) 40 MG  tablet, TAKE 1 TABLET BY MOUTH 3 TIMES WEEKLY (Patient taking differently: Take 20 mg by mouth daily.), Disp: 12 tablet, Rfl: 10   rosuvastatin (CRESTOR) 20 MG tablet, TAKE 1 TABLET BY MOUTH ONCE A DAY, Disp: 90 tablet, Rfl: 3   Semaglutide,0.25 or 0.5MG/DOS, (OZEMPIC, 0.25 OR 0.5 MG/DOSE,) 2 MG/3ML SOPN, Inject 0.25 mg into the skin once a week., Disp: 3 mL, Rfl: 0   Sodium Sulfate-Mag Sulfate-KCl (SUTAB) [phone] MG TABS, Use as directed, Disp: 24 tablet, Rfl: 0   traZODone (DESYREL) 50 MG tablet, Take 1 to 2 tablets by mouth once at bedtime as needed for sleep, Disp: 180 tablet, Rfl: 1   valsartan-hydrochlorothiazide (DIOVAN-HCT) 80-12.5 MG tablet, Take 1 tablet by mouth daily., Disp: 90 tablet, Rfl: 3   buPROPion (WELLBUTRIN XL) 300 MG 24 hr tablet, TAKE 1 TABLET BY MOUTH EVERY MORNING, Disp: 90 tablet, Rfl: 3   COVID-19 mRNA Vac-TriS, Pfizer, (PFIZER-BIONT COVID-19 VAC-TRIS) SUSP injection, Inject into the muscle., Disp: 0.3 mL, Rfl: 0   traZODone (DESYREL) 50 MG tablet, TAKE 1-2 TABLETS BY MOUTH ONCE DAILY AT BEDTIME AS NEEDED FOR SLEEP, Disp: 180 tablet, Rfl: 0  Cardiovascular studies:  EKG 08/22/2022: Sinus rhythm 74 bpm Normal EKG  CT cardiac scoring 08/02/2021: 1. Coronary calcium score of 12 (all in Lcx) is at the 65th percentile for the patient's age, sex and  race. 2. Calcified granuloma of the right lower lobe is consistent with prior granulomatous disease. 3. Nonspecific irregular ground-glass opacities in the lower lobes and lingula which have the appearance of scarring. This may relate to prior infection, inflammation or chronic disease. Correlation suggested with any history of pulmonary disease or infection.   Recent labs: 10/31/2022: Chol 177, TG 150, HDL 69, LDL 83  05/06/2022: Chol 177, TG 147, HDL 64, LDL 88  06/28/2021: Glucose 102, BUN/Cr 11/0.8. EGFR 83. Na/K 139/4.7.  Chol 193, TG 170, HDL 60, LDL 104    Review of Systems  Cardiovascular:  Negative for  chest pain, dyspnea on exertion, leg swelling, palpitations and syncope.  Musculoskeletal:  Positive for myalgias.       Vitals:   11/11/22 1113  BP: (!) 145/80  Pulse: 66  SpO2: 95%      Body mass index is 33.82 kg/m. Filed Weights   11/11/22 1113  Weight: 222 lb 6.4 oz (100.9 kg)     Objective:   Physical Exam Vitals and nursing note reviewed.  Constitutional:      General: She is not in acute distress. Neck:     Vascular: No JVD.  Cardiovascular:     Rate and Rhythm: Normal rate and regular rhythm.     Heart sounds: Normal heart sounds. No murmur heard. Pulmonary:     Effort: Pulmonary effort is normal.     Breath sounds: Normal breath sounds. No wheezing or rales.  Musculoskeletal:     Right lower leg: No edema.     Left lower leg: No edema.           Assessment & Recommendations:   66 y.o. Caucasian female with hypertension, hyperlipidemia.  Mixed hyperlipidemia: Calcium score 12 (08/2021)  Chol 177, TG 150, HDL 69, LDL 83 (10/2022) Currently on Crestor 40 mg daily, Zetia 10 mg daily.  She is reluctant to increase Crestor dose due to concern of myalgia. Okay to continue the current dose.   Hypertension: Suspect she has a component of white coat hypertension. No change made today.   Lipid panel and f/u in 1 year     Nigel Mormon, MD Pager: [phone] Office: [phone]

## 2022-11-12 ENCOUNTER — Other Ambulatory Visit (HOSPITAL_COMMUNITY): Payer: Self-pay

## 2022-11-12 ENCOUNTER — Ambulatory Visit (HOSPITAL_COMMUNITY): Payer: 59 | Admitting: Physical Therapy

## 2022-11-12 DIAGNOSIS — M5459 Other low back pain: Secondary | ICD-10-CM

## 2022-11-12 DIAGNOSIS — M25572 Pain in left ankle and joints of left foot: Secondary | ICD-10-CM | POA: Diagnosis not present

## 2022-11-12 MED ORDER — TRAZODONE HCL 50 MG PO TABS
50.0000 mg | ORAL_TABLET | Freq: Every evening | ORAL | 1 refills | Status: AC
  Filled 2022-11-12: qty 180, 90d supply, fill #0

## 2022-11-12 NOTE — Therapy (Signed)
OUTPATIENT PHYSICAL THERAPY THORACOLUMBAR EVALUATION   Patient Name: Kathleen Wise MRN: [id number] DOB:1956/01/29, 66 y.o., female Today's Date: 11/12/2022  END OF SESSION:  PT End of Session - 11/12/22 1513     Visit Number 2    Number of Visits 8    Date for PT Re-Evaluation 12/05/22    Authorization Type [employer]    PT Start Time 7253    PT Stop Time 1549    PT Time Calculation (min) 38 min    Activity Tolerance Patient tolerated treatment well    Behavior During Therapy WFL for tasks assessed/performed             Past Medical History:  Diagnosis Date   Anxiety    DUB (dysfunctional uterine bleeding)    GERD (gastroesophageal reflux disease)    Pelvic pain in female    Uterus, adenomyosis    Wears contact lenses    Past Surgical History:  Procedure Laterality Date   COLONOSCOPY     HYSTEROSCOPY WITH D & C  09/24/2012   Procedure: DILATATION AND CURETTAGE /HYSTEROSCOPY;  Surgeon: Maisie Fus, MD;  Location: [facility];  Service: Gynecology;  Laterality: N/A;   LAPAROSCOPIC ASSISTED VAGINAL HYSTERECTOMY N/A 11/01/2014   Procedure: LAPAROSCOPIC ASSISTED VAGINAL HYSTERECTOMY;  Surgeon: Maisie Fus, MD;  Location: [facility];  Service: Gynecology;  Laterality: N/A;   SALPINGOOPHORECTOMY Bilateral 11/01/2014   Procedure: SALPINGO OOPHORECTOMY;  Surgeon: Maisie Fus, MD;  Location: [facility];  Service: Gynecology;  Laterality: Bilateral;   Patient Active Problem List   Diagnosis Date Noted   Elevated coronary artery calcium score 11/05/2021   Family history of early CAD 09/09/2019   Essential hypertension 09/09/2019   Mixed hyperlipidemia 09/09/2019   S/P laparoscopic assisted vaginal hysterectomy (LAVH) 11/01/2014    PCP: Josetta Huddle MD  REFERRING PROVIDER: Lovenia Kim, MD  REFERRING DIAG: low back strain  Rationale for Evaluation and Treatment: Rehabilitation  THERAPY DIAG:  Other low back pain  ONSET DATE:  10/08/22  SUBJECTIVE:                                                                                                                                                                                           SUBJECTIVE STATEMENT: Doing well. Reports compliance with HEP. Feels stretching is helping. No pain currently.   PERTINENT HISTORY:  Arthritis, HTN  PAIN:  Are you having pain? Yes: NPRS scale: 0/10 Pain location: low back  Pain description: tight, weak  Aggravating factors: bending, lifting Relieving factors: rest, meds   PRECAUTIONS: None  WEIGHT BEARING RESTRICTIONS: No  FALLS:  Has patient fallen in last 6 months? Yes. Number of falls 1  LIVING ENVIRONMENT: Lives with: lives with their family and lives with their spouse Lives in: House/apartment Stairs: Yes: External: 2 steps; none Has following equipment at home: None  OCCUPATION: Nurse  PLOF: Independent  PATIENT GOALS: Have my back back   NEXT MD VISIT:   OBJECTIVE:   DIAGNOSTIC FINDINGS:  NA  PATIENT SURVEYS:  FOTO 42% function   COGNITION: Overall cognitive status: Within functional limits for tasks assessed     SENSATION: WFL  PALPATION: Mod TTP about lower bilateral lumbar paraspinals   LUMBAR ROM:   AROM eval  Flexion 75% limited  Extension 75% limited  Right lateral flexion 50% limited  Left lateral flexion 50% limited  Right rotation   Left rotation    (Blank rows = not tested)   LOWER EXTREMITY MMT:    MMT Right eval Left eval  Hip flexion 4+ 5  Hip extension 3+P! 3+  Hip abduction 4- 4  Hip adduction    Hip internal rotation    Hip external rotation    Knee flexion    Knee extension 4+ 5  Ankle dorsiflexion 5 5  Ankle plantarflexion    Ankle inversion    Ankle eversion     (Blank rows = not tested)  LUMBAR SPECIAL TESTS:  (-) SLR (+) sacral compression   FUNCTIONAL TESTS:  5 times sit to stand: 21 sec no hands (increased back pain)   GAIT:  TODAY'S  TREATMENT:                                                                                                                              DATE:  11/12/22 Ab brace 10 x 5"  Ab march x20 LTR 5 x10" SKTC 5 x 10" Bridge x 10 (feels tender in back) SLR with ab brace 2 x 10 Sidelying clamshell GTB 2 x 10   Attempted squat to chair but DC due to pain in knees  Band rows BTB 2 x 10 Shoulder extension BTB 2 x 10  Quadruped hip extension 2 x 10 each   11/07/22 Eval    PATIENT EDUCATION:  Education details: on Eval findings, POC and HEP  Person educated: Patient Education method: Explanation Education comprehension: verbalized understanding  HOME EXERCISE PROGRAM: Access Code: [email or web address] URL: [email or web address] 11/12/22 - Supine March  - 1-2 x daily - 7 x weekly - 2 sets - 10 reps - Small Range Straight Leg Raise  - 1-2 x daily - 7 x weekly - 2 sets - 10 reps - Clam with Resistance  - 1-2 x daily - 7 x weekly - 2 sets - 10 reps - Beginner Front Arm Support  - 1-2 x daily - 7 x weekly - 2 sets - 10 reps - Standing Shoulder Row with Anchored Resistance  - 1-2 x daily - 7 x weekly - 2 sets - 10 reps -  Shoulder extension with resistance - Neutral  - 1-2 x daily - 7 x weekly - 2 sets - 10 reps  Date: 11/07/2022 Prepared by: Josue Hector  Exercises - Supine Lower Trunk Rotation  - 2 x daily - 7 x weekly - 1 sets - 10 reps - 5-10 second hold - Hooklying Single Knee to Chest Stretch  - 2 x daily - 7 x weekly - 1 sets - 10 reps - 5-10 second hold - Supine Transversus Abdominis Bracing - Hands on Stomach  - 2 x daily - 7 x weekly - 1-2 sets - 10 reps - 5 second hold - Hooklying Gluteal Sets  - 2 x daily - 7 x weekly - 1-2 sets - 10 reps - 5 second hold  ASSESSMENT:  CLINICAL IMPRESSION: Patient tolerated session well today. Progressed core and Le strengthening activity with good return. Patient does note some pulling in lumbar during bridging. Attempted chair squats but  held due to complaint of knee pain. Patient educated on purpose and function of all added exercises. Updated HEP and issued handout. Patient will continue to benefit from skilled therapy services to reduce remaining deficits and improve functional ability.    OBJECTIVE IMPAIRMENTS: Abnormal gait, decreased activity tolerance, decreased mobility, difficulty walking, decreased ROM, decreased strength, increased fascial restrictions, increased muscle spasms, improper body mechanics, and pain.   ACTIVITY LIMITATIONS: carrying, lifting, bending, sitting, standing, squatting, sleeping, stairs, transfers, bed mobility, and locomotion level  PARTICIPATION LIMITATIONS: meal prep, cleaning, laundry, driving, shopping, community activity, occupation, and yard work  PERSONAL FACTORS:  NA  are also affecting patient's functional outcome.   REHAB POTENTIAL: Good  CLINICAL DECISION MAKING: Stable/uncomplicated  EVALUATION COMPLEXITY: Low   GOALS: SHORT TERM GOALS: Target date: 11/28/2022  Patient will be independent with initial HEP and self-management strategies to improve functional outcomes Baseline:  Goal status: INITIAL   LONG TERM GOALS: Target date: 12/19/2022  Patient will be independent with advanced HEP and self-management strategies to improve functional outcomes Baseline:  Goal status: INITIAL  2.  Patient will improve FOTO score to predicted value to indicate improvement in functional outcomes Baseline: 42% function  Goal status: INITIAL  3.  Patient will report reduction of back pain to 0/10 at rest for improved quality of life and ability to perform ADLs  Baseline: 3/10 Goal status: INITIAL  4. Patient will have equal to or > 4+/5 MMT throughout BLE to improve ability to perform functional mobility, stair ambulation and ADLs.  Baseline: See MMT Goal status: INITIAL   PLAN:  PT FREQUENCY: 2x/week  PT DURATION: 4 weeks  PLANNED INTERVENTIONS: Therapeutic exercises,  Therapeutic activity, Neuromuscular re-education, Balance training, Gait training, Patient/Family education, Joint manipulation, Joint mobilization, Stair training, Aquatic Therapy, Dry Needling, Electrical stimulation, Spinal manipulation, Spinal mobilization, Cryotherapy, Moist heat, scar mobilization, Taping, Traction, Ultrasound, Biofeedback, Ionotophoresis '4mg'$ /ml Dexamethasone, and Manual therapy. Marland Kitchen  PLAN FOR NEXT SESSION: Progress pain free lumbar ROM. Core and glute strengthening as tolerated. Deadbug, bridging, palloff press  3:59 PM, 11/12/22 Josue Hector PT DPT  Physical Therapist with [facility]  [phone]

## 2022-11-13 ENCOUNTER — Other Ambulatory Visit (HOSPITAL_COMMUNITY): Payer: Self-pay

## 2022-11-13 ENCOUNTER — Ambulatory Visit (HOSPITAL_COMMUNITY): Payer: 59

## 2022-11-13 ENCOUNTER — Encounter (HOSPITAL_COMMUNITY): Payer: Self-pay

## 2022-11-13 DIAGNOSIS — M5459 Other low back pain: Secondary | ICD-10-CM | POA: Diagnosis not present

## 2022-11-13 DIAGNOSIS — M25572 Pain in left ankle and joints of left foot: Secondary | ICD-10-CM

## 2022-11-13 MED ORDER — AZELAIC ACID 15 % EX GEL
CUTANEOUS | 5 refills | Status: AC
  Filled 2022-11-13: qty 50, 25d supply, fill #0
  Filled 2022-11-14: qty 50, 30d supply, fill #0

## 2022-11-13 NOTE — Therapy (Addendum)
OUTPATIENT PHYSICAL THERAPY THORACOLUMBAR TREATMENT   Patient Name: Kathleen Wise MRN: [id number] DOB:07-02-1956, 66 y.o., female Today's Date: 11/13/2022  END OF SESSION:   11/13/22 1602  PT Visits / Re-Eval  Visit Number 3  Number of Visits 8  Date for PT Re-Evaluation 12/05/22  Authorization  Authorization Type [employer]  PT Time Calculation  PT Start Time 1604  PT Stop Time 1642  PT Time Calculation (min) 38 min  PT - End of Session  Activity Tolerance Patient tolerated treatment well  Behavior During Therapy WFL for tasks assessed/performed    Past Medical History:  Diagnosis Date   Anxiety    DUB (dysfunctional uterine bleeding)    GERD (gastroesophageal reflux disease)    Pelvic pain in female    Uterus, adenomyosis    Wears contact lenses    Past Surgical History:  Procedure Laterality Date   COLONOSCOPY     HYSTEROSCOPY WITH D & C  09/24/2012   Procedure: DILATATION AND CURETTAGE /HYSTEROSCOPY;  Surgeon: Maisie Fus, MD;  Location: [facility];  Service: Gynecology;  Laterality: N/A;   LAPAROSCOPIC ASSISTED VAGINAL HYSTERECTOMY N/A 11/01/2014   Procedure: LAPAROSCOPIC ASSISTED VAGINAL HYSTERECTOMY;  Surgeon: Maisie Fus, MD;  Location: [facility];  Service: Gynecology;  Laterality: N/A;   SALPINGOOPHORECTOMY Bilateral 11/01/2014   Procedure: SALPINGO OOPHORECTOMY;  Surgeon: Maisie Fus, MD;  Location: [facility];  Service: Gynecology;  Laterality: Bilateral;   Patient Active Problem List   Diagnosis Date Noted   Elevated coronary artery calcium score 11/05/2021   Family history of early CAD 09/09/2019   Essential hypertension 09/09/2019   Mixed hyperlipidemia 09/09/2019   S/P laparoscopic assisted vaginal hysterectomy (LAVH) 11/01/2014    PCP: Josetta Huddle MD  REFERRING PROVIDER: Lovenia Kim, MD  REFERRING DIAG: low back strain  Rationale for Evaluation and Treatment: Rehabilitation  THERAPY DIAG:  Other low  back pain   ONSET DATE: 10/08/22  SUBJECTIVE:                                                                                                                                                                                           SUBJECTIVE STATEMENT: Doing well. Reports compliance with HEP. Feels stretching is helping. No pain currently.   Pt reports she was sore following session yesterday, no reports of pain currently.  Stated quadruped exercise irritated her hands/wrist.  PERTINENT HISTORY:  Arthritis, HTN  PAIN:  Are you having pain? Yes: NPRS scale: 0/10 Pain location: low back  Pain description: tight, weak  Aggravating factors: bending, lifting Relieving factors: rest, meds   PRECAUTIONS:  None  WEIGHT BEARING RESTRICTIONS: No  FALLS:  Has patient fallen in last 6 months? Yes. Number of falls 1  LIVING ENVIRONMENT: Lives with: lives with their family and lives with their spouse Lives in: House/apartment Stairs: Yes: External: 2 steps; none Has following equipment at home: None  OCCUPATION: Nurse  PLOF: Independent  PATIENT GOALS: Have my back back   NEXT MD VISIT:   OBJECTIVE:   DIAGNOSTIC FINDINGS:  NA  PATIENT SURVEYS:  FOTO 42% function   COGNITION: Overall cognitive status: Within functional limits for tasks assessed     SENSATION: WFL  PALPATION: Mod TTP about lower bilateral lumbar paraspinals   LUMBAR ROM:   AROM eval  Flexion 75% limited  Extension 75% limited  Right lateral flexion 50% limited  Left lateral flexion 50% limited  Right rotation   Left rotation    (Blank rows = not tested)   LOWER EXTREMITY MMT:    MMT Right eval Left eval  Hip flexion 4+ 5  Hip extension 3+P! 3+  Hip abduction 4- 4  Hip adduction    Hip internal rotation    Hip external rotation    Knee flexion    Knee extension 4+ 5  Ankle dorsiflexion 5 5  Ankle plantarflexion    Ankle inversion    Ankle eversion     (Blank rows = not  tested)  LUMBAR SPECIAL TESTS:  (-) SLR (+) sacral compression   FUNCTIONAL TESTS:  5 times sit to stand: 21 sec no hands (increased back pain)   GAIT:  TODAY'S TREATMENT:                                                                                                                              DATE:  11/13/22: Prone: UE/LE 10x 5"  Supine: Dead bug 10x 5" with ab set hooklying position Bridge 2x 10 LTR 5x 10"  Sidelying: abd 10x STS without hands, c/o knee pain, increase ease with elevated height   Standing: Shoulder extension 15x Row 15x Palloff 10x each Marching with shoulder extension (difficulty with SLS, therapist SBA)  11/12/22 Ab brace 10 x 5"  Ab march x20 LTR 5 x10" SKTC 5 x 10" Bridge x 10 (feels tender in back) SLR with ab brace 2 x 10 Sidelying clamshell GTB 2 x 10   Attempted squat to chair but DC due to pain in knees  Band rows BTB 2 x 10 Shoulder extension BTB 2 x 10  Quadruped hip extension 2 x 10 each   11/07/22 Eval    PATIENT EDUCATION:  Education details: on Eval findings, POC and HEP  Person educated: Patient Education method: Explanation Education comprehension: verbalized understanding  HOME EXERCISE PROGRAM: Access Code: [email or web address] URL: [email or web address] 11/12/22 - Supine March  - 1-2 x daily - 7 x weekly - 2 sets - 10 reps - Small Range Straight Leg Raise  - 1-2 x daily - 7 x weekly - 2  sets - 10 reps - Clam with Resistance  - 1-2 x daily - 7 x weekly - 2 sets - 10 reps - Beginner Front Arm Support  - 1-2 x daily - 7 x weekly - 2 sets - 10 reps - Standing Shoulder Row with Anchored Resistance  - 1-2 x daily - 7 x weekly - 2 sets - 10 reps - Shoulder extension with resistance - Neutral  - 1-2 x daily - 7 x weekly - 2 sets - 10 reps  Date: 11/07/2022 Prepared by: Josue Hector  Exercises - Supine Lower Trunk Rotation  - 2 x daily - 7 x weekly - 1 sets - 10 reps - 5-10 second hold - Hooklying Single Knee to  Chest Stretch  - 2 x daily - 7 x weekly - 1 sets - 10 reps - 5-10 second hold - Supine Transversus Abdominis Bracing - Hands on Stomach  - 2 x daily - 7 x weekly - 1-2 sets - 10 reps - 5 second hold - Hooklying Gluteal Sets  - 2 x daily - 7 x weekly - 1-2 sets - 10 reps - 5 second hold  ASSESSMENT:  CLINICAL IMPRESSION: Session focus with core and proximal strengthening.  Min cuieng for stability for core strengthening.  Attempted 90/90 dead bug which was too difficult due to core weakness, improved mechanics with hooklying position.  Added palloff and marching for core strengthening with minor LOB during new exercises, able to regain I.  Pt limited by c/o knee pain during STS this session, increased height with some relief.  No reports of pain at EOS.   OBJECTIVE IMPAIRMENTS: Abnormal gait, decreased activity tolerance, decreased mobility, difficulty walking, decreased ROM, decreased strength, increased fascial restrictions, increased muscle spasms, improper body mechanics, and pain.   ACTIVITY LIMITATIONS: carrying, lifting, bending, sitting, standing, squatting, sleeping, stairs, transfers, bed mobility, and locomotion level  PARTICIPATION LIMITATIONS: meal prep, cleaning, laundry, driving, shopping, community activity, occupation, and yard work  PERSONAL FACTORS:  NA  are also affecting patient's functional outcome.   REHAB POTENTIAL: Good  CLINICAL DECISION MAKING: Stable/uncomplicated  EVALUATION COMPLEXITY: Low   GOALS: SHORT TERM GOALS: Target date: 11/28/2022  Patient will be independent with initial HEP and self-management strategies to improve functional outcomes Baseline:  Goal status: IN PROGRESS   LONG TERM GOALS: Target date: 12/19/2022  Patient will be independent with advanced HEP and self-management strategies to improve functional outcomes Baseline:  Goal status: IN PROGRESS  2.  Patient will improve FOTO score to predicted value to indicate improvement in  functional outcomes Baseline: 42% function  Goal status: IN PROGRESS  3.  Patient will report reduction of back pain to 0/10 at rest for improved quality of life and ability to perform ADLs  Baseline: 3/10 Goal status: IN PROGRESS  4. Patient will have equal to or > 4+/5 MMT throughout BLE to improve ability to perform functional mobility, stair ambulation and ADLs.  Baseline: See MMT Goal status: IN PROGRESS   PLAN:  PT FREQUENCY: 2x/week  PT DURATION: 4 weeks  PLANNED INTERVENTIONS: Therapeutic exercises, Therapeutic activity, Neuromuscular re-education, Balance training, Gait training, Patient/Family education, Joint manipulation, Joint mobilization, Stair training, Aquatic Therapy, Dry Needling, Electrical stimulation, Spinal manipulation, Spinal mobilization, Cryotherapy, Moist heat, scar mobilization, Taping, Traction, Ultrasound, Biofeedback, Ionotophoresis '4mg'$ /ml Dexamethasone, and Manual therapy. Marland Kitchen  PLAN FOR NEXT SESSION: Progress pain free lumbar ROM. Core and glute strengthening as tolerated. Deadbug, bridging, palloff press.  Progress to lunges and gluteal strengthening to assist  with squats/STS.    Ihor Austin, LPTA/CLT; CBIS [phone]  4:04 PM, 11/13/22

## 2022-11-14 ENCOUNTER — Other Ambulatory Visit (HOSPITAL_COMMUNITY): Payer: Self-pay

## 2022-11-19 ENCOUNTER — Encounter (HOSPITAL_COMMUNITY): Payer: Self-pay | Admitting: Physical Therapy

## 2022-11-19 ENCOUNTER — Ambulatory Visit (HOSPITAL_COMMUNITY): Payer: 59 | Admitting: Physical Therapy

## 2022-11-19 DIAGNOSIS — M25572 Pain in left ankle and joints of left foot: Secondary | ICD-10-CM

## 2022-11-19 DIAGNOSIS — M5459 Other low back pain: Secondary | ICD-10-CM

## 2022-11-19 NOTE — Therapy (Signed)
OUTPATIENT PHYSICAL THERAPY THORACOLUMBAR TREATMENT   Patient Name: Kathleen Wise MRN: [id number] DOB:[birth year], 66 y.o., female Today's Date: 11/19/2022 PHYSICAL THERAPY DISCHARGE SUMMARY  Visits from Start of Care: 4  Current functional level related to goals / functional outcomes: See below   Remaining deficits: See below   Education / Equipment: See assessment    Patient agrees to discharge. Patient goals were met. Patient is being discharged due to meeting the stated rehab goals.  END OF SESSION:   PT End of Session - 11/19/22 1303     Visit Number 4    Number of Visits 8    Date for PT Re-Evaluation 12/05/22    Authorization Type [employer] [employer]    PT Start Time 1302    PT Stop Time 1340    PT Time Calculation (min) 38 min    Activity Tolerance Patient tolerated treatment well    Behavior During Therapy WFL for tasks assessed/performed               Past Medical History:  Diagnosis Date   Anxiety    DUB (dysfunctional uterine bleeding)    GERD (gastroesophageal reflux disease)    Pelvic pain in female    Uterus, adenomyosis    Wears contact lenses    Past Surgical History:  Procedure Laterality Date   COLONOSCOPY     HYSTEROSCOPY WITH D & C  09/24/2012   Procedure: DILATATION AND CURETTAGE /HYSTEROSCOPY;  Surgeon: Maisie Fus, MD;  Location: [facility];  Service: Gynecology;  Laterality: N/A;   LAPAROSCOPIC ASSISTED VAGINAL HYSTERECTOMY N/A 11/01/2014   Procedure: LAPAROSCOPIC ASSISTED VAGINAL HYSTERECTOMY;  Surgeon: Maisie Fus, MD;  Location: [facility];  Service: Gynecology;  Laterality: N/A;   SALPINGOOPHORECTOMY Bilateral 11/01/2014   Procedure: SALPINGO OOPHORECTOMY;  Surgeon: Maisie Fus, MD;  Location: [facility];  Service: Gynecology;  Laterality: Bilateral;   Patient Active Problem List   Diagnosis Date Noted   Elevated coronary artery calcium score 11/05/2021   Family history of early CAD 09/09/2019    Essential hypertension 09/09/2019   Mixed hyperlipidemia 09/09/2019   S/P laparoscopic assisted vaginal hysterectomy (LAVH) 11/01/2014    PCP: Josetta Huddle MD  REFERRING PROVIDER: Lovenia Kim, MD  REFERRING DIAG: low back strain  Rationale for Evaluation and Treatment: Rehabilitation  THERAPY DIAG:  Other low back pain  Pain in left ankle and joints of left foot  ONSET DATE: 10/08/22  SUBJECTIVE:  SUBJECTIVE STATEMENT: Doing well today. Has not been having pain. No pain currently  PERTINENT HISTORY:  Arthritis, HTN  PAIN:  Are you having pain? Yes: NPRS scale: 0/10 Pain location: low back  Pain description: tight, weak  Aggravating factors: bending, lifting Relieving factors: rest, meds   PRECAUTIONS: None  WEIGHT BEARING RESTRICTIONS: No  FALLS:  Has patient fallen in last 6 months? Yes. Number of falls 1  LIVING ENVIRONMENT: Lives with: lives with their family and lives with their spouse Lives in: House/apartment Stairs: Yes: External: 2 steps; none Has following equipment at home: None  OCCUPATION: Nurse  PLOF: Independent  PATIENT GOALS: Have my back back   NEXT MD VISIT:   OBJECTIVE:   DIAGNOSTIC FINDINGS:  NA  PATIENT SURVEYS:  FOTO 79% function (was 42% function)  COGNITION: Overall cognitive status: Within functional limits for tasks assessed     SENSATION: WFL  PALPATION: Mod TTP about lower bilateral lumbar paraspinals   LUMBAR ROM:   AROM eval 11/19/22  Flexion 75% limited 25% limited  Extension 75% limited 25% limited  Right lateral flexion 50% limited 25% limited  Left lateral flexion 50% limited 25% limited  Right rotation    Left rotation     (Blank rows = not tested)   LOWER EXTREMITY MMT:    MMT Right eval Left eval  Right 11/19/22 Left 11/19/22  Hip flexion 4+ _0 Hip extension 3+P! 3+ 5 4+  Hip abduction 4- 4 5 4+  Hip adduction      Hip internal rotation      Hip external rotation      Knee flexion      Knee extension 4+ _1 Ankle dorsiflexion _2 Ankle plantarflexion      Ankle inversion      Ankle eversion       (Blank rows = not tested)  LUMBAR SPECIAL TESTS:  (-) SLR (-) sacral compression   FUNCTIONAL TESTS:  5 times sit to stand: 21 sec no hands (increased back pain)   GAIT:  TODAY'S TREATMENT:                                                                                                                              DATE:  11/19/22 Reassess  FOTO AROM MMT  Lifting mechanics with box lifts 15lb   11/13/22: Prone: UE/LE 10x 5"  Supine: Dead bug 10x 5" with ab set hooklying position Bridge 2x 10 LTR 5x 10"  Sidelying: abd 10x STS without hands, c/o knee pain, increase ease with elevated height   Standing: Shoulder extension 15x Row 15x Palloff 10x each Marching with shoulder extension (difficulty with SLS, therapist SBA)  11/12/22 Ab brace 10 x 5"  Ab march x20 LTR 5 x10" SKTC 5 x 10" Bridge x 10 (feels tender in back) SLR with ab brace 2 x 10 Sidelying clamshell GTB 2 x 10  Attempted squat to chair but DC due to pain in knees  Band rows BTB 2 x 10 Shoulder extension BTB 2 x 10  Quadruped hip extension 2 x 10 each   11/07/22 Eval    PATIENT EDUCATION:  Education details: on Eval findings, POC and HEP  Person educated: Patient Education method: Explanation Education comprehension: verbalized understanding  HOME EXERCISE PROGRAM: Access Code: [email or web address] URL: [email or web address] 11/[email or web address]/22 - Hip Abduction with Resistance Loop  - 1-2 x daily - 7 x weekly - 2 sets - 10 reps - Hip Extension with Resistance Loop  - 1-2 x daily - 7 x weekly - 2 sets - 10 reps  11/12/22 - Supine March  - 1-2 x daily - 7 x weekly - 2 sets -  10 reps - Small Range Straight Leg Raise  - 1-2 x daily - 7 x weekly - 2 sets - 10 reps - Clam with Resistance  - 1-2 x daily - 7 x weekly - 2 sets - 10 reps - Beginner Front Arm Support  - 1-2 x daily - 7 x weekly - 2 sets - 10 reps - Standing Shoulder Row with Anchored Resistance  - 1-2 x daily - 7 x weekly - 2 sets - 10 reps - Shoulder extension with resistance - Neutral  - 1-2 x daily - 7 x weekly - 2 sets - 10 reps  Date: 11/07/2022 Prepared by: Josue Hector  Exercises - Supine Lower Trunk Rotation  - 2 x daily - 7 x weekly - 1 sets - 10 reps - 5-10 second hold - Hooklying Single Knee to Chest Stretch  - 2 x daily - 7 x weekly - 1 sets - 10 reps - 5-10 second hold - Supine Transversus Abdominis Bracing - Hands on Stomach  - 2 x daily - 7 x weekly - 1-2 sets - 10 reps - 5 second hold - Hooklying Gluteal Sets  - 2 x daily - 7 x weekly - 1-2 sets - 10 reps - 5 second hold  ASSESSMENT:  CLINICAL IMPRESSION: Patient has made good progress and is currently with all therapy goals MET. Significant improvement in strength and pain free lumbar mobility has been seen. Patient reports no issues at the moment, able to perform ADLs without pain. Feels confident with home exercises. Reviewed HEP and answered all questions. Encouraged patient to follow up with therapy services with any further questions or concerns.   OBJECTIVE IMPAIRMENTS: Abnormal gait, decreased activity tolerance, decreased mobility, difficulty walking, decreased ROM, decreased strength, increased fascial restrictions, increased muscle spasms, improper body mechanics, and pain.   ACTIVITY LIMITATIONS: carrying, lifting, bending, sitting, standing, squatting, sleeping, stairs, transfers, bed mobility, and locomotion level  PARTICIPATION LIMITATIONS: meal prep, cleaning, laundry, driving, shopping, community activity, occupation, and yard work  PERSONAL FACTORS:  NA  are also affecting patient's functional outcome.   REHAB  POTENTIAL: Good  CLINICAL DECISION MAKING: Stable/uncomplicated  EVALUATION COMPLEXITY: Low   GOALS: SHORT TERM GOALS: Target date: 11/28/2022  Patient will be independent with initial HEP and self-management strategies to improve functional outcomes Baseline:  Goal status: MET   LONG TERM GOALS: Target date: 12/19/2022  Patient will be independent with advanced HEP and self-management strategies to improve functional outcomes Baseline:  Goal status: MET  2.  Patient will improve FOTO score to predicted value to indicate improvement in functional outcomes Baseline: 79% function  Goal status: MET  3.  Patient will report reduction of back pain to 0/10 at rest for  improved quality of life and ability to perform ADLs  Baseline: 0/10 Goal status: MET  4. Patient will have equal to or > 4+/5 MMT throughout BLE to improve ability to perform functional mobility, stair ambulation and ADLs.  Baseline: See MMT Goal status: MET   PLAN:  PT FREQUENCY: 2x/week  PT DURATION: 4 weeks  PLANNED INTERVENTIONS: Therapeutic exercises, Therapeutic activity, Neuromuscular re-education, Balance training, Gait training, Patient/Family education, Joint manipulation, Joint mobilization, Stair training, Aquatic Therapy, Dry Needling, Electrical stimulation, Spinal manipulation, Spinal mobilization, Cryotherapy, Moist heat, scar mobilization, Taping, Traction, Ultrasound, Biofeedback, Ionotophoresis 26m/ml Dexamethasone, and Manual therapy. .Marland Kitchen PLAN FOR NEXT SESSION: DC to HEP    1:20 PM, 11/19/22 CJosue HectorPT DPT  Physical Therapist with [facility] [phone]

## 2022-11-21 ENCOUNTER — Other Ambulatory Visit (HOSPITAL_COMMUNITY): Payer: Self-pay

## 2022-11-21 ENCOUNTER — Other Ambulatory Visit: Payer: Self-pay

## 2022-11-21 ENCOUNTER — Encounter (HOSPITAL_COMMUNITY): Payer: No Typology Code available for payment source | Admitting: Physical Therapy

## 2022-11-21 DIAGNOSIS — Z6833 Body mass index (BMI) 33.0-33.9, adult: Secondary | ICD-10-CM | POA: Diagnosis not present

## 2022-11-21 DIAGNOSIS — M549 Dorsalgia, unspecified: Secondary | ICD-10-CM | POA: Diagnosis not present

## 2022-11-21 DIAGNOSIS — R7301 Impaired fasting glucose: Secondary | ICD-10-CM | POA: Diagnosis not present

## 2022-11-21 DIAGNOSIS — E669 Obesity, unspecified: Secondary | ICD-10-CM | POA: Diagnosis not present

## 2022-11-21 MED ORDER — OZEMPIC (1 MG/DOSE) 4 MG/3ML ~~LOC~~ SOPN
1.0000 mg | PEN_INJECTOR | SUBCUTANEOUS | 0 refills | Status: AC
  Filled 2022-11-21: qty 3, 28d supply, fill #0

## 2022-11-22 ENCOUNTER — Other Ambulatory Visit (HOSPITAL_COMMUNITY): Payer: Self-pay

## 2022-11-26 ENCOUNTER — Encounter (HOSPITAL_COMMUNITY): Payer: No Typology Code available for payment source

## 2022-11-29 ENCOUNTER — Encounter (HOSPITAL_COMMUNITY): Payer: No Typology Code available for payment source

## 2023-01-21 ENCOUNTER — Other Ambulatory Visit (HOSPITAL_COMMUNITY): Payer: Self-pay

## 2023-02-05 ENCOUNTER — Other Ambulatory Visit: Payer: Self-pay

## 2023-02-05 MED ORDER — VALSARTAN-HYDROCHLOROTHIAZIDE 80-12.5 MG PO TABS: 1.0000 | ORAL_TABLET | Freq: Every day | ORAL | 3 refills | Status: AC

## 2023-03-18 ENCOUNTER — Other Ambulatory Visit: Payer: Self-pay

## 2023-03-18 DIAGNOSIS — E782 Mixed hyperlipidemia: Secondary | ICD-10-CM

## 2023-03-18 DIAGNOSIS — R931 Abnormal findings on diagnostic imaging of heart and coronary circulation: Secondary | ICD-10-CM

## 2023-03-18 MED ORDER — ROSUVASTATIN CALCIUM 20 MG PO TABS: ORAL_TABLET | Freq: Every day | ORAL | 3 refills | Status: AC

## 2023-03-18 MED ORDER — EZETIMIBE 10 MG PO TABS: 10.0000 mg | ORAL_TABLET | Freq: Every day | ORAL | 3 refills | Status: AC

## 2023-11-12 ENCOUNTER — Ambulatory Visit: Payer: Self-pay | Admitting: Cardiology
